# Patient Record
Sex: Female | Born: 1970 | Race: White | Hispanic: No | Marital: Married | Smoking: Former smoker
Health system: Southern US, Community
[De-identification: ages and names within clinical notes are randomized; demographics above are authoritative.]

## PROBLEM LIST (undated history)

## (undated) DIAGNOSIS — R1013 Epigastric pain: Secondary | ICD-10-CM

## (undated) HISTORY — PX: NO PAST SURGERIES: SHX2092

## (undated) HISTORY — DX: Epigastric pain: R10.13

---

## 1998-08-16 ENCOUNTER — Ambulatory Visit (HOSPITAL_COMMUNITY): Admission: RE | Admit: 1998-08-16 | Discharge: 1998-08-16 | Payer: Self-pay | Admitting: Obstetrics & Gynecology

## 1999-01-11 ENCOUNTER — Inpatient Hospital Stay (HOSPITAL_COMMUNITY): Admission: AD | Admit: 1999-01-11 | Discharge: 1999-01-13 | Payer: Self-pay | Admitting: Obstetrics and Gynecology

## 2000-12-25 ENCOUNTER — Encounter: Payer: Self-pay | Admitting: Obstetrics and Gynecology

## 2000-12-25 ENCOUNTER — Encounter: Admission: RE | Admit: 2000-12-25 | Discharge: 2000-12-25 | Payer: Self-pay | Admitting: Obstetrics and Gynecology

## 2002-01-11 ENCOUNTER — Other Ambulatory Visit: Admission: RE | Admit: 2002-01-11 | Discharge: 2002-01-11 | Payer: Self-pay | Admitting: Obstetrics and Gynecology

## 2003-02-06 ENCOUNTER — Other Ambulatory Visit: Admission: RE | Admit: 2003-02-06 | Discharge: 2003-02-06 | Payer: Self-pay | Admitting: Obstetrics and Gynecology

## 2005-10-20 ENCOUNTER — Other Ambulatory Visit: Admission: RE | Admit: 2005-10-20 | Discharge: 2005-10-20 | Payer: Self-pay | Admitting: Obstetrics and Gynecology

## 2005-11-05 ENCOUNTER — Encounter: Admission: RE | Admit: 2005-11-05 | Discharge: 2005-11-05 | Payer: Self-pay | Admitting: Obstetrics and Gynecology

## 2006-01-05 ENCOUNTER — Other Ambulatory Visit: Admission: RE | Admit: 2006-01-05 | Discharge: 2006-01-05 | Payer: Self-pay | Admitting: Obstetrics and Gynecology

## 2008-08-31 ENCOUNTER — Emergency Department (HOSPITAL_COMMUNITY): Admission: EM | Admit: 2008-08-31 | Discharge: 2008-08-31 | Payer: Self-pay | Admitting: Emergency Medicine

## 2010-03-05 ENCOUNTER — Ambulatory Visit (HOSPITAL_COMMUNITY): Admission: RE | Admit: 2010-03-05 | Discharge: 2010-03-05 | Payer: Self-pay | Admitting: Family Medicine

## 2011-04-14 ENCOUNTER — Other Ambulatory Visit (HOSPITAL_COMMUNITY): Payer: Self-pay | Admitting: Family Medicine

## 2011-04-14 DIAGNOSIS — Z1231 Encounter for screening mammogram for malignant neoplasm of breast: Secondary | ICD-10-CM

## 2011-04-17 ENCOUNTER — Ambulatory Visit (HOSPITAL_COMMUNITY)
Admission: RE | Admit: 2011-04-17 | Discharge: 2011-04-17 | Disposition: A | Discharge status: Home / Self Care | Payer: Medicaid Other | Source: Ambulatory Visit | Attending: Family Medicine | Admitting: Family Medicine

## 2011-04-17 DIAGNOSIS — Z1231 Encounter for screening mammogram for malignant neoplasm of breast: Secondary | ICD-10-CM

## 2011-05-13 NOTE — L&D Delivery Note (Signed)
Delivery Note At 11:38 AM a viable female was delivered via Vaginal, Spontaneous Delivery (Presentation: Left Occiput Anterior), nuchal cord x 3- unwound after birth.   Thick meconium- NICU called for birth, arrived right after, but infant had spontaneous respiration/cry. Cord clamped x 2 and cut by FOB, then to warmer for assessment by NICU. APGAR: 9, 9; weight: pending.  Infant back to mom, skin-to-skin, and attempting latch.  Placenta status: Intact, Spontaneous, battledore insertion.  Cord: 3 vessels with the following complications: None.    Anesthesia: Local  Episiotomy: None Lacerations: 2nd deg perineal Suture Repair: 3.0 monocryl Est. Blood Loss (mL):  Mom to postpartum.  Baby to nursery-stable.  Plans to breastfeed, desires Depo for contraception.  [name] 04/10/2012, 12:05 PM

## 2011-08-21 LAB — OB RESULTS CONSOLE GC/CHLAMYDIA: Chlamydia: NEGATIVE

## 2011-09-11 ENCOUNTER — Other Ambulatory Visit (HOSPITAL_COMMUNITY)
Admission: RE | Admit: 2011-09-11 | Discharge: 2011-09-11 | Disposition: A | Discharge status: Home / Self Care | Payer: Medicaid Other | Source: Ambulatory Visit | Attending: Obstetrics and Gynecology | Admitting: Obstetrics and Gynecology

## 2011-09-11 ENCOUNTER — Other Ambulatory Visit: Payer: Self-pay | Admitting: Adult Health

## 2011-09-11 DIAGNOSIS — Z01419 Encounter for gynecological examination (general) (routine) without abnormal findings: Secondary | ICD-10-CM | POA: Insufficient documentation

## 2011-09-11 DIAGNOSIS — Z113 Encounter for screening for infections with a predominantly sexual mode of transmission: Secondary | ICD-10-CM | POA: Insufficient documentation

## 2012-04-09 ENCOUNTER — Encounter (HOSPITAL_COMMUNITY): Payer: Self-pay

## 2012-04-09 ENCOUNTER — Inpatient Hospital Stay (HOSPITAL_COMMUNITY)
Admission: AD | Admit: 2012-04-09 | Discharge: 2012-04-09 | Disposition: A | Discharge status: Home / Self Care | Payer: Medicaid Other | Source: Ambulatory Visit | Attending: Family Medicine | Admitting: Family Medicine

## 2012-04-09 DIAGNOSIS — O479 False labor, unspecified: Secondary | ICD-10-CM | POA: Insufficient documentation

## 2012-04-09 NOTE — MAU Provider Note (Signed)
I examined pt and agree with documentation above and resident plan of care. MUHAMMAD,Joeann Steppe  

## 2012-04-09 NOTE — MAU Note (Signed)
Pt states u/c's q6-8 minutes apart. Hx rapid labor, was ? 1cm in office

## 2012-04-09 NOTE — MAU Note (Signed)
Dr. Sherron Flemings Paz notified amnisure negative, pt would like to walk around, orders to walk and Dr will recheck in one hour.

## 2012-04-09 NOTE — MAU Provider Note (Signed)
Chief Complaint:  Contractions  HPI: Brianna Walsh is a 41 y.o. [phone] at [redacted]w[redacted]d who presents to maternity admissions reporting contractions since 4:00 am. She has history of rapid deliveries in the past. Also she reports having vaginal discharge that wet her inner thighs. No gush of fluid per vagina or vaginal bleeding. Good fetal movement.   Pregnancy Course: f/u by [facility] Uncomplicated prenatal course.  Past Medical History: History reviewed. No pertinent past medical history.  Past obstetric history: OB History    Grav Para Term Preterm Abortions TAB SAB Ect Mult Living   4 3 3       3      Past Surgical History: Past Surgical History  Procedure Date  . No past surgeries     Family History: Family History  Problem Relation Age of Onset  . Other Neg Hx     Social History: History  Substance Use Topics  . Smoking status: Never Smoker   . Smokeless tobacco: Never Used  . Alcohol Use: No    Allergies:  Allergies  Allergen Reactions  . Penicillins Other (See Comments)    Childhood reaction.    Meds:  Prescriptions prior to admission  Medication Sig Dispense Refill  . OMEPRAZOLE PO Take 1 tablet by mouth daily as needed. For acid reflux.      . Prenatal Vit-Fe Fumarate-FA (PRENATAL MULTIVITAMIN) TABS Take 1 tablet by mouth daily.        ROS: Pertinent findings in history of present illness.  Physical Exam  There were no vitals taken for this visit. GENERAL: Well-developed, well-nourished female in no acute distress.  HEENT: normocephalic HEART: normal rate RESP: normal effort ABDOMEN: Soft, non-tender, gravid appropriate for gestational age EXTREMITIES: Nontender, no edema NEURO: alert and oriented SPECULUM EXAM: NEFG, white discharge, no blood, cervix clean. Dilation: 1 Effacement (%): 60 Cervical Position: Posterior Station: -1 Presentation: Vertex Exam by:: Dr. Sherron Flemings Paz  FHT:  Baseline 145 , moderate variability, accelerations present, no  decelerations Contractions: q 2-3 mins   Labs: Results for orders placed during the hospital encounter of 04/09/12 (from the past 24 hour(s))  AMNISURE RUPTURE OF MEMBRANE (ROM)     Status: Normal   Collection Time   04/09/12  9:49 AM      Component Value Range   Amnisure ROM NEGATIVE     MAU Course: Negative ferning and amnisure.  Will let pt off monitor and ambulate due to good contraction pattern. Will recheck in 2 h  Dilation: 1 Effacement (%): 50 Cervical Position: Posterior Station: -3 Presentation: Vertex Exam by:: Dr. Aviva Signs and Jerolyn Center, CNM FHT: category II. Contractions spaced to irregular pattern. Non painful.  Assessment: 1. False labor     Plan: Discharge home Labor precautions and fetal kick counts    Medication List     As of 04/09/2012 10:17 AM    ASK your doctor about these medications         OMEPRAZOLE PO   Take 1 tablet by mouth daily as needed. For acid reflux.      prenatal multivitamin Tabs   Take 1 tablet by mouth daily.        Brianna Jefcoat [place] Criselda Peaches, MD 04/09/2012 10:17 AM

## 2012-04-09 NOTE — MAU Note (Signed)
Dr. Rolene Arbour notified pt in MAU for labor eval, hx rapid labor, AMA, ctx's q2-4, RN to check cervix and call with results.

## 2012-04-10 ENCOUNTER — Inpatient Hospital Stay (HOSPITAL_COMMUNITY)
Admission: AD | Admit: 2012-04-10 | Discharge: 2012-04-11 | DRG: 775 | Disposition: A | Discharge status: Home / Self Care | Payer: Medicaid Other | Source: Ambulatory Visit | Attending: Obstetrics and Gynecology | Admitting: Obstetrics and Gynecology

## 2012-04-10 ENCOUNTER — Encounter (HOSPITAL_COMMUNITY): Payer: Self-pay | Admitting: *Deleted

## 2012-04-10 DIAGNOSIS — O09529 Supervision of elderly multigravida, unspecified trimester: Secondary | ICD-10-CM | POA: Diagnosis present

## 2012-04-10 LAB — CBC
HCT: 37.3 % (ref 36.0–46.0)
Hemoglobin: 12.3 g/dL (ref 12.0–15.0)
MCH: 27.5 pg (ref 26.0–34.0)
MCHC: 33 g/dL (ref 30.0–36.0)
MCV: 83.3 fL (ref 78.0–100.0)
RDW: 13.6 % (ref 11.5–15.5)

## 2012-04-10 LAB — OB RESULTS CONSOLE ANTIBODY SCREEN: Antibody Screen: NEGATIVE

## 2012-04-10 LAB — OB RESULTS CONSOLE ABO/RH: RH Type: POSITIVE

## 2012-04-10 LAB — OB RESULTS CONSOLE HEPATITIS B SURFACE ANTIGEN: Hepatitis B Surface Ag: NEGATIVE

## 2012-04-10 LAB — OB RESULTS CONSOLE RPR: RPR: NONREACTIVE

## 2012-04-10 MED ORDER — DIBUCAINE 1 % RE OINT: 1.0000 "application " | TOPICAL_OINTMENT | RECTAL | Status: DC | PRN

## 2012-04-10 MED ORDER — LIDOCAINE HCL (PF) 1 % IJ SOLN
30.0000 mL | INTRAMUSCULAR | Status: DC | PRN
  Administered 2012-04-10: 30 mL via SUBCUTANEOUS

## 2012-04-10 MED ORDER — DIPHENHYDRAMINE HCL 50 MG/ML IJ SOLN: 12.5000 mg | INTRAMUSCULAR | Status: DC | PRN

## 2012-04-10 MED ORDER — ONDANSETRON HCL 4 MG/2ML IJ SOLN: 4.0000 mg | Freq: Four times a day (QID) | INTRAMUSCULAR | Status: DC | PRN

## 2012-04-10 MED ORDER — EPHEDRINE 5 MG/ML INJ: 10.0000 mg | INTRAVENOUS | Status: DC | PRN

## 2012-04-10 MED ORDER — OXYTOCIN 40 UNITS IN LACTATED RINGERS INFUSION - SIMPLE MED: 62.5000 mL/h | INTRAVENOUS | Status: DC

## 2012-04-10 MED ORDER — OXYTOCIN 40 UNITS IN LACTATED RINGERS INFUSION - SIMPLE MED: 62.5000 mL/h | INTRAVENOUS | Status: DC | PRN

## 2012-04-10 MED ORDER — ONDANSETRON HCL 4 MG/2ML IJ SOLN: 4.0000 mg | INTRAMUSCULAR | Status: DC | PRN

## 2012-04-10 MED ORDER — OXYTOCIN BOLUS FROM INFUSION
500.0000 mL | INTRAVENOUS | Status: DC
  Administered 2012-04-10: 500 mL via INTRAVENOUS

## 2012-04-10 MED ORDER — LACTATED RINGERS IV SOLN: 500.0000 mL | INTRAVENOUS | Status: DC | PRN

## 2012-04-10 MED ORDER — LACTATED RINGERS IV SOLN
500.0000 mL | Freq: Once | INTRAVENOUS | Status: AC
  Administered 2012-04-10: 500 mL via INTRAVENOUS

## 2012-04-10 MED ORDER — MEASLES, MUMPS & RUBELLA VAC ~~LOC~~ INJ
0.5000 mL | INJECTION | Freq: Once | SUBCUTANEOUS | Status: DC
  Filled 2012-04-10: qty 0.5

## 2012-04-10 MED ORDER — FENTANYL 2.5 MCG/ML BUPIVACAINE 1/10 % EPIDURAL INFUSION (WH - ANES): 14.0000 mL/h | INTRAMUSCULAR | Status: DC

## 2012-04-10 MED ORDER — PHENYLEPHRINE 40 MCG/ML (10ML) SYRINGE FOR IV PUSH (FOR BLOOD PRESSURE SUPPORT): 80.0000 ug | PREFILLED_SYRINGE | INTRAVENOUS | Status: DC | PRN

## 2012-04-10 MED ORDER — LIDOCAINE HCL (PF) 1 % IJ SOLN
INTRAMUSCULAR | Status: AC
  Administered 2012-04-10: 30 mL via SUBCUTANEOUS
  Filled 2012-04-10: qty 30

## 2012-04-10 MED ORDER — ZOLPIDEM TARTRATE 5 MG PO TABS: 5.0000 mg | ORAL_TABLET | Freq: Every evening | ORAL | Status: DC | PRN

## 2012-04-10 MED ORDER — ONDANSETRON HCL 4 MG PO TABS: 4.0000 mg | ORAL_TABLET | ORAL | Status: DC | PRN

## 2012-04-10 MED ORDER — ACETAMINOPHEN 325 MG PO TABS: 650.0000 mg | ORAL_TABLET | ORAL | Status: DC | PRN

## 2012-04-10 MED ORDER — LANOLIN HYDROUS EX OINT: TOPICAL_OINTMENT | CUTANEOUS | Status: DC | PRN

## 2012-04-10 MED ORDER — OXYTOCIN 40 UNITS IN LACTATED RINGERS INFUSION - SIMPLE MED
INTRAVENOUS | Status: AC
  Filled 2012-04-10: qty 1000

## 2012-04-10 MED ORDER — IBUPROFEN 600 MG PO TABS
600.0000 mg | ORAL_TABLET | Freq: Four times a day (QID) | ORAL | Status: DC | PRN
  Administered 2012-04-10: 600 mg via ORAL
  Filled 2012-04-10: qty 1

## 2012-04-10 MED ORDER — IBUPROFEN 600 MG PO TABS
600.0000 mg | ORAL_TABLET | Freq: Four times a day (QID) | ORAL | Status: DC
  Administered 2012-04-10 – 2012-04-11 (×4): 600 mg via ORAL
  Filled 2012-04-10 (×4): qty 1

## 2012-04-10 MED ORDER — PANTOPRAZOLE SODIUM 40 MG PO TBEC
40.0000 mg | DELAYED_RELEASE_TABLET | Freq: Every day | ORAL | Status: DC
  Filled 2012-04-10 (×3): qty 1

## 2012-04-10 MED ORDER — SODIUM CHLORIDE 0.9 % IJ SOLN: 3.0000 mL | INTRAMUSCULAR | Status: DC | PRN

## 2012-04-10 MED ORDER — FENTANYL CITRATE 0.05 MG/ML IJ SOLN
INTRAMUSCULAR | Status: AC
  Filled 2012-04-10: qty 2

## 2012-04-10 MED ORDER — BENZOCAINE-MENTHOL 20-0.5 % EX AERO: 1.0000 "application " | INHALATION_SPRAY | CUTANEOUS | Status: DC | PRN

## 2012-04-10 MED ORDER — BISACODYL 10 MG RE SUPP: 10.0000 mg | Freq: Every day | RECTAL | Status: DC | PRN

## 2012-04-10 MED ORDER — OXYCODONE-ACETAMINOPHEN 5-325 MG PO TABS: 1.0000 | ORAL_TABLET | ORAL | Status: DC | PRN

## 2012-04-10 MED ORDER — FLEET ENEMA 7-19 GM/118ML RE ENEM: 1.0000 | ENEMA | Freq: Every day | RECTAL | Status: DC | PRN

## 2012-04-10 MED ORDER — PRENATAL MULTIVITAMIN CH
1.0000 | ORAL_TABLET | Freq: Every day | ORAL | Status: DC
  Administered 2012-04-11: 1 via ORAL
  Filled 2012-04-10: qty 1

## 2012-04-10 MED ORDER — TETANUS-DIPHTH-ACELL PERTUSSIS 5-2.5-18.5 LF-MCG/0.5 IM SUSP: 0.5000 mL | Freq: Once | INTRAMUSCULAR | Status: DC

## 2012-04-10 MED ORDER — SENNOSIDES-DOCUSATE SODIUM 8.6-50 MG PO TABS
2.0000 | ORAL_TABLET | Freq: Every day | ORAL | Status: DC
  Administered 2012-04-10: 2 via ORAL

## 2012-04-10 MED ORDER — WITCH HAZEL-GLYCERIN EX PADS: 1.0000 "application " | MEDICATED_PAD | CUTANEOUS | Status: DC | PRN

## 2012-04-10 MED ORDER — FENTANYL CITRATE 0.05 MG/ML IJ SOLN: 100.0000 ug | Freq: Once | INTRAMUSCULAR | Status: DC

## 2012-04-10 MED ORDER — SODIUM CHLORIDE 0.9 % IJ SOLN
3.0000 mL | Freq: Two times a day (BID) | INTRAMUSCULAR | Status: DC
  Administered 2012-04-10: 3 mL via INTRAVENOUS

## 2012-04-10 MED ORDER — CITRIC ACID-SODIUM CITRATE 334-500 MG/5ML PO SOLN: 30.0000 mL | ORAL | Status: DC | PRN

## 2012-04-10 MED ORDER — LACTATED RINGERS IV SOLN
INTRAVENOUS | Status: DC
  Administered 2012-04-10: 11:00:00 via INTRAVENOUS

## 2012-04-10 MED ORDER — SIMETHICONE 80 MG PO CHEW: 80.0000 mg | CHEWABLE_TABLET | ORAL | Status: DC | PRN

## 2012-04-10 MED ORDER — SODIUM CHLORIDE 0.9 % IV SOLN: 250.0000 mL | INTRAVENOUS | Status: DC | PRN

## 2012-04-10 MED ORDER — DIPHENHYDRAMINE HCL 25 MG PO CAPS: 25.0000 mg | ORAL_CAPSULE | Freq: Four times a day (QID) | ORAL | Status: DC | PRN

## 2012-04-10 NOTE — MAU Note (Signed)
Pt reports having ctx feel lots of pressure no leaking reported

## 2012-04-10 NOTE — Progress Notes (Signed)
Brianna Walsh is a 41 y.o. [phone] at [redacted]w[redacted]d admitted for active labor  Subjective: Very uncomfortable w/ uc's, 'just wants water broken so i can have a baby'.  Husband supportive at bs.   Objective: Ht 5\' 6"  (1.676 m)  Wt 73.483 kg (162 lb)  BMI 26.15 kg/m2      FHT:  FHR: 155 bpm, variability: moderate,  accelerations:  Present,  decelerations:  Present variables, prolonged UC:   regular, every 2-4 minutes SVE:   Dilation: 4.5 Effacement (%): 90 Station: 0 Exam by:: Genella Rife RN AROM small amount moderate meconium  Labs: Lab Results  Component Value Date   WBC 16.0* 04/10/2012   HGB 12.3 04/10/2012   HCT 37.3 04/10/2012   MCV 83.3 04/10/2012   PLT 228 04/10/2012    Assessment / Plan: SOL, actually 4.5cm, now arom'd  Labor: Progressing normally Preeclampsia:  n/a Fetal Wellbeing:  Category II Pain Control:  desires epidural I/D:  n/a Anticipated MOD:  NSVD  [name] 04/10/2012, 11:21 AM

## 2012-04-10 NOTE — H&P (Signed)
Brianna Walsh is a 60 [id number]  female at [redacted]w[redacted]d presenting in active labor, membranes intact.  Regular care @ [facility] beginning at 6wks.  Has been followed w/ twice weekly nst's since 32wks d/t AMA-all reactive.  U/S @ 36wks, bpp 8/8, afi wnl, efw 2736gm/38.8%. Genetic screening, anatomy scan , and 2hr glucola normal, gbs neg. H/O rapid labors.   Maternal Medical History:  Reason for admission: Reason for admission: contractions.  Fetal activity: Perceived fetal activity is normal.   Last perceived fetal movement was within the past hour.    Prenatal complications: no prenatal complications Prenatal Complications - Diabetes: none.    OB History    Grav Para Term Preterm Abortions TAB SAB Ect Mult Living   4 3 3       3      History reviewed. No pertinent past medical history. Past Surgical History  Procedure Date  . No past surgeries    Family History: family history is negative for Other. Social History:  reports that she has never smoked. She has never used smokeless tobacco. She reports that she does not drink alcohol or use illicit drugs.   Prenatal Transfer Tool  Maternal Diabetes: No Genetic Screening: Normal Maternal Ultrasounds/Referrals: Normal Fetal Ultrasounds or other Referrals:  None Maternal Substance Abuse:  No Significant Maternal Medications:  None Significant Maternal Lab Results:  Lab values include: Group B Strep negative Other Comments:  None  Review of Systems  Constitutional: Negative.   HENT: Negative.   Eyes: Negative.   Respiratory: Negative.   Cardiovascular: Negative.   Gastrointestinal: Positive for abdominal pain (r/t uc's).  Genitourinary: Negative.   Musculoskeletal: Negative.   Skin: Negative.   Neurological: Negative.   Endo/Heme/Allergies: Negative.   Psychiatric/Behavioral: Negative.     Dilation: 9 Effacement (%): 100 Station: 0 Exam by:: K.Wilson,RN There were no vitals taken for this visit. Maternal Exam:  Uterine  Assessment: Contraction strength is firm.  Contraction frequency is regular.   Abdomen: Fetal presentation: vertex     Physical Exam  Constitutional: She is oriented to person, place, and time. She appears well-developed and well-nourished.  HENT:  Head: Normocephalic.  Neck: Normal range of motion.  Cardiovascular: Normal rate and regular rhythm.   Respiratory: Effort normal and breath sounds normal.  GI: Soft.       gravid  Genitourinary: Vagina normal and uterus normal.  Musculoskeletal: Normal range of motion.  Neurological: She is alert and oriented to person, place, and time. She has normal reflexes.  Skin: Skin is warm and dry.  Psychiatric: She has a normal mood and affect. Her behavior is normal. Judgment and thought content normal.    FHT: 145, mod variability, 10x10accels, mild variable decel= Cat 2 UCs: q 2-4, strong Prenatal labs: ABO, Rh:  O+ Antibody:  neg Rubella:  immune RPR:   neg HBsAg:   neg HIV:   neg GBS:   neg  Assessment/Plan: A:  [redacted]w[redacted]d SIUP  AMA  Active labor- advanced dilation  GBS neg  Cat II FHR  P:  Admit to BS  May have IV pain meds/epidural at maternal request  Expectant management  Anticipate NSVD  [name] 04/10/2012, 10:59 AM

## 2012-04-11 MED ORDER — IBUPROFEN 600 MG PO TABS: 600.0000 mg | ORAL_TABLET | Freq: Four times a day (QID) | ORAL | Status: DC | PRN

## 2012-04-11 NOTE — Discharge Summary (Signed)
Obstetric Discharge Summary Reason for Admission: onset of labor Prenatal Procedures: ultrasound Intrapartum Procedures: spontaneous vaginal delivery Postpartum Procedures: none Complications-Operative and Postpartum: 2nd degree perineal laceration  Eating, drinking, voiding well.  + flatus. Lochia wnl.  Pain well controlled w/ meds. No complaints. Desires early d/c   Hemoglobin  Date Value Range Status  04/10/2012 12.3  12.0 - 15.0 g/dL Final     HCT  Date Value Range Status  04/10/2012 37.3  36.0 - 46.0 % Final    Physical Exam:  General: alert, cooperative and no distress Lochia: appropriate Uterine Fundus: firm Incision: n/a DVT Evaluation: No evidence of DVT seen on physical exam. Negative Homan's sign. No cords or calf tenderness. No significant calf/ankle edema.  Discharge Diagnoses: Term Pregnancy-delivered  Discharge Information: Date: 04/11/2012 Activity: pelvic rest Diet: routine Medications: PNV and Ibuprofen Condition: stable Instructions: refer to practice specific booklet Discharge to: home Follow-up Information    Follow up with [facility]. Schedule an appointment as soon as possible for a visit in 6 weeks.   Contact information:   [address] [place] [address] [phone]         Newborn Data: Live born female  Birth Weight: 6 lb 5.9 oz (2890 g) APGAR: 9, 9  Home with mother. Breastfeeding, thinking about depo for contraception.   [name] 04/11/2012, 7:24 AM

## 2012-04-12 NOTE — Discharge Summary (Signed)
Attestation of Attending Supervision of Advanced Practitioner (CNM/NP): Evaluation and management procedures were performed by the Advanced Practitioner under my supervision and collaboration.  I have reviewed the Advanced Practitioner's note and chart, and I agree with the management and plan.  Kaytelynn Scripter 04/12/2012 12:41 PM   

## 2012-04-12 NOTE — Progress Notes (Signed)
Post discharge chart review completed.  

## 2012-04-12 NOTE — H&P (Signed)
Attestation of Attending Supervision of Advanced Practitioner (CNM/NP): Evaluation and management procedures were performed by the Advanced Practitioner under my supervision and collaboration.  I have reviewed the Advanced Practitioner's note and chart, and I agree with the management and plan.  Brianna Walsh 04/12/2012 12:41 PM

## 2012-06-09 ENCOUNTER — Ambulatory Visit: Payer: Medicaid Other | Admitting: Orthopedic Surgery

## 2012-06-22 ENCOUNTER — Ambulatory Visit (INDEPENDENT_AMBULATORY_CARE_PROVIDER_SITE_OTHER): Payer: Medicaid Other

## 2012-06-22 ENCOUNTER — Encounter: Payer: Self-pay | Admitting: Orthopedic Surgery

## 2012-06-22 ENCOUNTER — Ambulatory Visit (INDEPENDENT_AMBULATORY_CARE_PROVIDER_SITE_OTHER): Payer: Medicaid Other | Admitting: Orthopedic Surgery

## 2012-06-22 ENCOUNTER — Other Ambulatory Visit: Payer: Self-pay | Admitting: Orthopedic Surgery

## 2012-06-22 VITALS — BP 104/60 | Ht 65.0 in | Wt 145.0 lb

## 2012-06-22 DIAGNOSIS — M25539 Pain in unspecified wrist: Secondary | ICD-10-CM

## 2012-06-22 DIAGNOSIS — M654 Radial styloid tenosynovitis [de Quervain]: Secondary | ICD-10-CM

## 2012-06-22 NOTE — Patient Instructions (Addendum)
De Quervain's Tenosynovitis De Quervain's tenosynovitis involves inflammation of one or two tendon linings (sheaths) or strain of one or two tendons to the thumb: extensor pollicis brevis (EPB), or abductor pollicis longus (APL). This causes pain on the side of the wrist and base of the thumb. Tendon sheaths secrete a fluid that lubricates the tendon, allowing the tendon to move smoothly. When the sheath becomes inflamed, the tendon cannot move freely in the sheath. Both the EPB and APL tendons are important for proper use of the hand. The EPB tendon is important for straightening the thumb. The APL tendon is important for moving the thumb away from the index finger (abducting). The two tendons pass through a small tube (canal) in the wrist, near the base of the thumb. When the tendons become inflamed, pain is usually felt in this area. SYMPTOMS   Pain, tenderness, swelling, warmth, or redness over the base of the thumb and thumb side of the wrist.  Pain that gets worse when straightening the thumb.  Pain that gets worse when moving the thumb away from the index finger, against resistance.  Pain with pinching or gripping.  Locking or catching of the thumb.  Limited motion of the thumb.  Crackling sound (crepitation) when the tendon or thumb is moved or touched.  Fluid-filled cyst in the area of the base of the thumb. CAUSES   Tenosynovitis is often linked with overuse of the wrist.  Tenosynovitis may be caused by repeated injury to the thumb muscle and tendon units, and with repeated motions of the hand and wrist, due to friction of the tendon within the lining (sheath).  Tenosynovitis may also be due to a sudden increase in activity or change in activity. RISK INCREASES WITH:  Sports that involve repeated hand and wrist motions (golf, bowling, tennis, squash, racquetball).  Heavy labor.  Poor physical wrist strength and flexibility.  Failure to warm up properly before practice or  play.  Female gender.  New mothers who hold their baby's head for long periods or lift infants with thumbs in the infant's armpit (axilla). PREVENTION  Warm up and stretch properly before practice or competition.  Allow enough time for rest and recovery between practices and competition.  Maintain appropriate conditioning:  Cardiovascular fitness.  Forearm, wrist, and hand flexibility.  Muscle strength and endurance.  Use proper exercise technique. PROGNOSIS  This condition is usually curable within 6 weeks, if treated properly with non-surgical treatment and resting of the affected area.  RELATED COMPLICATIONS   Longer healing time if not properly treated or if not given enough time to heal.  Chronic inflammation, causing recurring symptoms of tenosynovitis. Permanent pain or restriction of movement.  Risks of surgery: infection, bleeding, injury to nerves (numbness of the thumb), continued pain, incomplete release of the tendon sheath, recurring symptoms, cutting of the tendons, tendons sliding out of position, weakness of the thumb, thumb stiffness. TREATMENT  First, treatment involves the use of medicine and ice, to reduce pain and inflammation. Patients are encouraged to stop or modify activities that aggravate the injury. Stretching and strengthening exercises may be advised. Exercises may be completed at home or with a therapist. You may be fitted with a brace or splint, to limit motion and allow the injury to heal. Your caregiver may also choose to give you a corticosteroid injection, to reduce the pain and inflammation. If non-surgical treatment is not successful, surgery may be needed. Most tenosynovitis surgeries are done as outpatient procedures (you go home the   same day). Surgery may involve local, regional (whole arm), or general anesthesia.  MEDICATION   If pain medicine is needed, nonsteroidal anti-inflammatory medicines (aspirin and ibuprofen), or other minor pain  relievers (acetaminophen), are often advised.  Do not take pain medicine for 7 days before surgery.  Prescription pain relievers are often prescribed only after surgery. Use only as directed and only as much as you need.  Corticosteroid injections may be given if your caregiver thinks they are needed. There is a limited number of times these injections may be given. COLD THERAPY   Cold treatment (icing) should be applied for 10 to 15 minutes every 2 to 3 hours for inflammation and pain, and immediately after activity that aggravates your symptoms. Use ice packs or an ice massage. SEEK MEDICAL CARE IF:   Symptoms get worse or do not improve in 2 to 4 weeks, despite treatment.  You experience pain, numbness, or coldness in the hand.  Blue, gray, or dark color appears in the fingernails.  Any of the following occur after surgery: increased pain, swelling, redness, drainage of fluids, bleeding in the affected area, or signs of infection.  New, unexplained symptoms develop. (Drugs used in treatment may produce side effects.) Document Released: 04/28/2005 Document Revised: 07/21/2011 Document Reviewed: 08/10/2008 [facility] Patient Information 2013 [place], [place].  Treatment wrist splinting x6 weeks and oral anti-inflammatory of choice x6 weeks, you should continue to ice applied for 20 minutes 3 times a day

## 2012-06-22 NOTE — Progress Notes (Signed)
Patient ID: Brianna Walsh, female   DOB: [birth year], 42 y.o.   MRN: [id number] Chief Complaint  Patient presents with  . Wrist Pain    Left wrist pain, no injury pain started 12/2011    Patient is recently postpartum she had a healthy girl she started having pain in her left and right wrist prior to delivery in August of 2013 she was diagnosed with carpal tunnel syndrome but she was sure that it was not she was placed in a wrist splint she is to ice but no NSAIDs secondary to the pregnancy she now complains of 4/10 sharp left wrist pain over the first extensor compartment which is constant it seems to be worse at night, no relief at this point worse with sleeping and she has a swollen nodule in the area of the first extensor compartment  Review of systems she denied weight loss she denied weight gain blurred vision skin changes numbness or tingling    History reviewed. No pertinent past medical history.  BP 104/60  Ht 5\' 5"  (1.651 m)  Wt 145 lb (65.772 kg)  BMI 24.13 kg/m2  General appearance is normal Orientation x3 normal Mood and affect normal Ambulation noncontributory but normal  Right wrist has a similar area of swelling but minimal tenderness normal range of motion normal stability normal strength normal skin really a negative Finkelstein's maneuver  Left wrist hand shows a nodule over the first extensor compartment near the radial styloid with tenderness painful extension of the thumb painful Finkelstein over normal range of motion normal stability normal strength normal skin normal pulse normal perfusion normal sensation  Medical decision making order x-ray  Interpret imaging see report x-ray normal  Diagnosis new problem no further workup planned other than the x-ray that we've taken  Overall risk recommend prescription medication bracing  Followup in 6 weeks.

## 2012-08-03 ENCOUNTER — Ambulatory Visit: Payer: Medicaid Other | Admitting: Orthopedic Surgery

## 2012-12-29 ENCOUNTER — Ambulatory Visit: Payer: 59

## 2012-12-29 ENCOUNTER — Ambulatory Visit (INDEPENDENT_AMBULATORY_CARE_PROVIDER_SITE_OTHER): Payer: Medicaid Other | Admitting: Orthopedic Surgery

## 2012-12-29 ENCOUNTER — Ambulatory Visit (INDEPENDENT_AMBULATORY_CARE_PROVIDER_SITE_OTHER): Payer: Medicaid Other

## 2012-12-29 VITALS — BP 118/84 | Ht 65.0 in | Wt 149.0 lb

## 2012-12-29 DIAGNOSIS — M25552 Pain in left hip: Secondary | ICD-10-CM

## 2012-12-29 DIAGNOSIS — M25559 Pain in unspecified hip: Secondary | ICD-10-CM

## 2012-12-29 DIAGNOSIS — M25551 Pain in right hip: Secondary | ICD-10-CM

## 2012-12-29 NOTE — Patient Instructions (Addendum)
Ibuprofen as needed.

## 2012-12-29 NOTE — Progress Notes (Signed)
  Subjective:    Patient ID: Brianna Walsh, female    DOB: April 15, 1971, 42 y.o.   MRN: [id number]  HPI Comments: 65 or female previously seen by chiropractor for back pain which started in 2000 and presents now with sudden onset of pain which started in December she describes sharp back pain dull hip pain over the "bone" of her pelvis her pain is better with ibuprofen is worse when she's sleeping    Hip Pain  There was no injury mechanism. The pain is present in the right hip and left hip. The pain is at a severity of 8/10. The pain has been intermittent since onset. Associated symptoms include numbness and tingling. Pertinent negatives include no inability to bear weight, loss of motion, loss of sensation or muscle weakness.      Review of Systems  Musculoskeletal:       Stiffness  Neurological: Positive for tingling and numbness.  All other systems reviewed and are negative.       Objective:   Physical Exam   Vital signs are stable as recorded  General appearance is normal, body habitus normal  The patient is alert and oriented x 3  The patient's mood and affect are normal  Gait assessment: Normal  The cardiovascular exam reveals normal pulses and temperature without edema or  swelling.  The lymphatic system is negative for palpable lymph nodes  The sensory exam is normal.  There are no pathologic reflexes.  Balance is normal.   Exam of the back and lower extremities  Inspection she is tender in her lower back today but the SI joints are nontender she has a negative Faber test bilaterally range of motion in the hips is normal including the slammed on test for acetabular April impingement both hips are stable Both lower extremities exhibit Strength grade 5 motor strength  Skin normal, no rash, or laceration. Provocative tests negative  Bilateral x-rays normal both hips    Assessment & Plan:   Unexplained hip pain probably coming from the lower back relief  with ibuprofen indicates OK to take ibuprofen while breast-feeding according to her doctor's

## 2013-01-06 ENCOUNTER — Ambulatory Visit: Payer: Medicaid Other | Admitting: Adult Health

## 2013-04-25 ENCOUNTER — Telehealth: Payer: Self-pay | Admitting: *Deleted

## 2013-04-26 NOTE — Telephone Encounter (Signed)
Pt stated that she wants to get back on the Nuvaring. Pt advised to make an appointment with a provider to discuss [facility].

## 2013-05-10 ENCOUNTER — Encounter: Payer: Self-pay | Admitting: Advanced Practice Midwife

## 2013-05-10 ENCOUNTER — Ambulatory Visit (INDEPENDENT_AMBULATORY_CARE_PROVIDER_SITE_OTHER): Payer: Medicaid Other | Admitting: Advanced Practice Midwife

## 2013-05-10 VITALS — BP 110/78 | Ht 66.0 in | Wt 152.0 lb

## 2013-05-10 DIAGNOSIS — Z3202 Encounter for pregnancy test, result negative: Secondary | ICD-10-CM

## 2013-05-10 DIAGNOSIS — Z0000 Encounter for general adult medical examination without abnormal findings: Secondary | ICD-10-CM

## 2013-05-10 DIAGNOSIS — Z3049 Encounter for surveillance of other contraceptives: Secondary | ICD-10-CM

## 2013-05-10 MED ORDER — ETONOGESTREL-ETHINYL ESTRADIOL 0.12-0.015 MG/24HR VA RING: VAGINAL_RING | VAGINAL | Status: DC

## 2013-05-10 NOTE — Progress Notes (Signed)
KIMANH TEMPLEMAN 42 y.o.   Here for [facility] physical/ wants Nuva Ring. Quit breastfeeding 3 weeks ago.  Nonsmoker,  Has used NR in past.  Filed Vitals:   05/10/13 0840  BP: 110/78   History reviewed. No pertinent past medical history. Past Surgical History  Procedure Laterality Date  . No past surgeries     Family History  Problem Relation Age of Onset  . Other Neg Hx   . Cancer    . Cancer Mother     breast  . Cancer Father     lung    KEYRI SALBERG 23 y.o.  Filed Vitals:   05/10/13 0840  BP: 110/78     Past Medical History: History reviewed. No pertinent past medical history.  Past Surgical History: Past Surgical History  Procedure Laterality Date  . No past surgeries      Family History: Family History  Problem Relation Age of Onset  . Other Neg Hx   . Cancer    . Cancer Mother     breast  . Cancer Father     lung    Social History: History  Substance Use Topics  . Smoking status: Former Smoker -- 10 years    Types: Cigarettes    Quit date: 05/10/1997  . Smokeless tobacco: Never Used  . Alcohol Use: No    Allergies:  Allergies  Allergen Reactions  . Penicillins Other (See Comments)    Childhood reaction.     History of Present Illness:  Current outpatient prescriptions:Pseudoeph-CPM-DM-APAP (COLD CAPLETS PO), Take by mouth., Disp: , Rfl: ;  etonogestrel-ethinyl estradiol (NUVARING) 0.12-0.015 MG/24HR vaginal ring, Insert vaginally and leave in place for 3 consecutive weeks, then remove for 1 week., Disp: 1 each, Rfl: 12;  ibuprofen (ADVIL,MOTRIN) 600 MG tablet, Take 1 tablet (600 mg total) by mouth every 6 (six) hours as needed for pain., Disp: 30 tablet, Rfl: 0 norethindrone (MICRONOR,CAMILA,ERRIN) 0.35 MG tablet, Take 1 tablet by mouth daily., Disp: , Rfl: ;  OMEPRAZOLE PO, Take 1 tablet by mouth daily as needed. For acid reflux., Disp: , Rfl: ;  Prenatal Vit-Fe Fumarate-FA (PRENATAL MULTIVITAMIN) TABS, Take 1 tablet by mouth daily., Disp: ,  Rfl:     Review of Systems   Patient denies any headaches, blurred vision, shortness of breath, chest pain, abdominal pain, problems with bowel movements, urination, or intercourse.   Physical Exam: General:  Well developed, well nourished, no acute distress Skin:  Warm and dry Neck:  Midline trachea, normal thyroid Lungs; Clear to auscultation bilaterally Breast:  Deferred d/t recent breastfeeding.  Mammogram encouraged after the first of the year Cardiovascular: Regular rate and rhythm Abdomen:  Soft, non tender, no hepatosplenomegaly Pelvic:  Deferred.  Not pap smear candidate, had GYN exam < 1 year ago   Extremities:  No swelling or varicosities noted Psych:  No mood changes   Impression: Normal well woman exam Start NR now:  Use b/u for 3 weeks Screening labs 3 months (d/t breastfeeding)     Plan:

## 2013-08-03 ENCOUNTER — Ambulatory Visit: Payer: Medicaid Other | Admitting: Orthopedic Surgery

## 2013-10-05 ENCOUNTER — Telehealth: Payer: Self-pay | Admitting: Adult Health

## 2013-10-05 ENCOUNTER — Encounter: Payer: Self-pay | Admitting: Adult Health

## 2013-10-05 ENCOUNTER — Ambulatory Visit (INDEPENDENT_AMBULATORY_CARE_PROVIDER_SITE_OTHER): Payer: 59 | Admitting: Adult Health

## 2013-10-05 ENCOUNTER — Ambulatory Visit (HOSPITAL_COMMUNITY)
Admission: RE | Admit: 2013-10-05 | Discharge: 2013-10-05 | Disposition: A | Discharge status: Home / Self Care | Payer: 59 | Source: Ambulatory Visit | Attending: Adult Health | Admitting: Adult Health

## 2013-10-05 ENCOUNTER — Other Ambulatory Visit: Payer: Self-pay | Admitting: Adult Health

## 2013-10-05 ENCOUNTER — Telehealth: Payer: Self-pay | Admitting: Obstetrics and Gynecology

## 2013-10-05 VITALS — BP 128/92 | Ht 65.0 in | Wt 148.0 lb

## 2013-10-05 DIAGNOSIS — R1013 Epigastric pain: Secondary | ICD-10-CM

## 2013-10-05 DIAGNOSIS — K769 Liver disease, unspecified: Secondary | ICD-10-CM | POA: Insufficient documentation

## 2013-10-05 HISTORY — DX: Epigastric pain: R10.13

## 2013-10-05 LAB — COMPREHENSIVE METABOLIC PANEL
ALBUMIN: 4.1 g/dL (ref 3.5–5.2)
ALT: 10 U/L (ref 0–35)
AST: 13 U/L (ref 0–37)
Alkaline Phosphatase: 53 U/L (ref 39–117)
BUN: 7 mg/dL (ref 6–23)
CO2: 26 meq/L (ref 19–32)
CREATININE: 0.75 mg/dL (ref 0.50–1.10)
Calcium: 9 mg/dL (ref 8.4–10.5)
Chloride: 105 mEq/L (ref 96–112)
Glucose, Bld: 81 mg/dL (ref 70–99)
POTASSIUM: 4 meq/L (ref 3.5–5.3)
Sodium: 137 mEq/L (ref 135–145)
Total Bilirubin: 0.6 mg/dL (ref 0.2–1.2)
Total Protein: 7 g/dL (ref 6.0–8.3)

## 2013-10-05 LAB — LIPASE: Lipase: <10 U/L (ref 0–75)

## 2013-10-05 LAB — AMYLASE: AMYLASE: 41 U/L (ref 0–105)

## 2013-10-05 MED ORDER — PROMETHAZINE HCL 25 MG PO TABS: 25.0000 mg | ORAL_TABLET | Freq: Four times a day (QID) | ORAL | Status: DC | PRN

## 2013-10-05 NOTE — Progress Notes (Signed)
Subjective:     Patient ID: AMARIONNA ARCA, female   DOB: [birth year], 43 y.o.   MRN: [id number]  HPI Jalaine is a 43 year old white female in complaining of worsening pain in epigastric area,feels deep, no vomiting or nausea, but has bloating.No fever, but can't eat a lot.Has no relief with peeing or BM.Ate last night and had tea about 10 am today.  Review of Systems See HPI Reviewed past medical,surgical, social and family history. Reviewed medications and allergies.     Objective:   Physical Exam BP 128/92  Ht _0  (1.651 m)  Wt 148 lb (67.132 kg)  BMI 24.63 kg/m2  LMP 05/08/2015UPT negative, urine dipstick negative, has good bowel sounds in all 4 quadrants, has tenderness epigastric area and near navel.On pelvic exam external genitalia normal, vagina pink with good rugae, cervix smooth and bulbous, with Negative  CMT, uterus NSSC no masses or tenderness noted in adnexa.    Assessment:    Epigastric pain    Plan:     Get abdominal US now at [facility]  Check CBC,CMP ESR and lipase and amylase Will talk after [place]

## 2013-10-05 NOTE — Patient Instructions (Signed)
Go get [place] We will talk

## 2013-10-05 NOTE — Telephone Encounter (Signed)
Pt aware of [place] will rx phenergan and will talk in am after labs back ,will refer to surgeon

## 2013-10-05 NOTE — Telephone Encounter (Signed)
Spoke with pt. Pt is having abd pain. Has noticed it for 2 days, but pain is getting worse. Call transferred to front desk for appt. Pt to be seen today at 1:15pm. JSY

## 2013-10-06 ENCOUNTER — Telehealth: Payer: Self-pay | Admitting: Adult Health

## 2013-10-06 LAB — CBC
HEMATOCRIT: 40.5 % (ref 36.0–46.0)
Hemoglobin: 13.7 g/dL (ref 12.0–15.0)
MCH: 27.8 pg (ref 26.0–34.0)
MCHC: 33.8 g/dL (ref 30.0–36.0)
MCV: 82.2 fL (ref 78.0–100.0)
Platelets: 290 10*3/uL (ref 150–400)
RBC: 4.93 MIL/uL (ref 3.87–5.11)
RDW: 13.5 % (ref 11.5–15.5)
WBC: 8.6 10*3/uL (ref 4.0–10.5)

## 2013-10-06 LAB — SEDIMENTATION RATE: Sed Rate: 14 mm/hr (ref 0–22)

## 2013-10-06 NOTE — Telephone Encounter (Signed)
Pt aware has appt 6/2 with Dr Arnoldo Morale and she feels better

## 2013-10-06 NOTE — Telephone Encounter (Signed)
Left message to call me.

## 2014-02-08 ENCOUNTER — Other Ambulatory Visit: Payer: Self-pay | Admitting: Advanced Practice Midwife

## 2014-02-08 DIAGNOSIS — Z1231 Encounter for screening mammogram for malignant neoplasm of breast: Secondary | ICD-10-CM

## 2014-02-09 ENCOUNTER — Ambulatory Visit (HOSPITAL_COMMUNITY)
Admission: RE | Admit: 2014-02-09 | Discharge: 2014-02-09 | Disposition: A | Discharge status: Home / Self Care | Payer: 59 | Source: Ambulatory Visit | Attending: Advanced Practice Midwife | Admitting: Advanced Practice Midwife

## 2014-02-09 DIAGNOSIS — Z1231 Encounter for screening mammogram for malignant neoplasm of breast: Secondary | ICD-10-CM | POA: Insufficient documentation

## 2014-02-10 ENCOUNTER — Other Ambulatory Visit: Payer: Self-pay | Admitting: Advanced Practice Midwife

## 2014-02-10 DIAGNOSIS — R928 Other abnormal and inconclusive findings on diagnostic imaging of breast: Secondary | ICD-10-CM

## 2014-02-14 ENCOUNTER — Ambulatory Visit (HOSPITAL_COMMUNITY)
Admission: RE | Admit: 2014-02-14 | Discharge: 2014-02-14 | Disposition: A | Discharge status: Home / Self Care | Payer: 59 | Source: Ambulatory Visit | Attending: Advanced Practice Midwife | Admitting: Advanced Practice Midwife

## 2014-02-14 ENCOUNTER — Other Ambulatory Visit: Payer: Self-pay | Admitting: Advanced Practice Midwife

## 2014-02-14 DIAGNOSIS — R928 Other abnormal and inconclusive findings on diagnostic imaging of breast: Secondary | ICD-10-CM | POA: Diagnosis present

## 2014-02-14 DIAGNOSIS — R921 Mammographic calcification found on diagnostic imaging of breast: Secondary | ICD-10-CM

## 2014-02-15 ENCOUNTER — Encounter: Payer: Self-pay | Admitting: Advanced Practice Midwife

## 2014-02-15 ENCOUNTER — Ambulatory Visit (INDEPENDENT_AMBULATORY_CARE_PROVIDER_SITE_OTHER): Payer: 59 | Admitting: Advanced Practice Midwife

## 2014-02-15 ENCOUNTER — Other Ambulatory Visit (HOSPITAL_COMMUNITY)
Admission: RE | Admit: 2014-02-15 | Discharge: 2014-02-15 | Disposition: A | Discharge status: Home / Self Care | Payer: 59 | Source: Ambulatory Visit | Attending: Advanced Practice Midwife | Admitting: Advanced Practice Midwife

## 2014-02-15 VITALS — BP 110/80 | Ht 65.2 in | Wt 153.0 lb

## 2014-02-15 DIAGNOSIS — Z01419 Encounter for gynecological examination (general) (routine) without abnormal findings: Secondary | ICD-10-CM | POA: Insufficient documentation

## 2014-02-15 DIAGNOSIS — Z1151 Encounter for screening for human papillomavirus (HPV): Secondary | ICD-10-CM | POA: Insufficient documentation

## 2014-02-15 DIAGNOSIS — D1803 Hemangioma of intra-abdominal structures: Secondary | ICD-10-CM

## 2014-02-15 DIAGNOSIS — N63 Unspecified lump in unspecified breast: Secondary | ICD-10-CM | POA: Insufficient documentation

## 2014-02-15 MED ORDER — ETONOGESTREL-ETHINYL ESTRADIOL 0.12-0.015 MG/24HR VA RING: VAGINAL_RING | VAGINAL | Status: DC

## 2014-02-15 MED ORDER — ETONOGESTREL-ETHINYL ESTRADIOL 0.12-0.015 MG/24HR VA RING
VAGINAL_RING | VAGINAL | Status: DC
Start: 2014-02-15 — End: 2017-06-19

## 2014-02-15 NOTE — Progress Notes (Signed)
Brianna Walsh 43 y.o.  Filed Vitals:   02/15/14 1118  BP: 110/80     Past Medical History: Past Medical History  Diagnosis Date  . Epigastric abdominal pain 10/05/2013    Past Surgical History: Past Surgical History  Procedure Laterality Date  . No past surgeries      Family History: Family History  Problem Relation Age of Onset  . Other Neg Hx   . Cancer Mother     breast  . Cancer Father     lung  . Cancer Maternal Grandmother   . Cancer Maternal Grandfather     lung    Social History: History  Substance Use Topics  . Smoking status: Former Smoker -- 10 years    Types: Cigarettes    Quit date: 05/10/1997  . Smokeless tobacco: Never Used  . Alcohol Use: No    Allergies:  Allergies  Allergen Reactions  . Penicillins Other (See Comments)    Childhood reaction.     Current outpatient prescriptions:etonogestrel-ethinyl estradiol (NUVARING) 0.12-0.015 MG/24HR vaginal ring, Insert vaginally and leave in place for 3 consecutive weeks, then remove for 1 week., Disp: 1 each, Rfl: 12;  etonogestrel-ethinyl estradiol (NUVARING) 0.12-0.015 MG/24HR vaginal ring, Insert vaginally and leave in place for 3 consecutive weeks, then remove for 1 week., Disp: 1 each, Rfl: 12 ibuprofen (ADVIL,MOTRIN) 600 MG tablet, Take 1 tablet (600 mg total) by mouth every 6 (six) hours as needed for pain., Disp: 30 tablet, Rfl: 0;  OMEPRAZOLE PO, Take 1 tablet by mouth daily as needed. For acid reflux., Disp: , Rfl: ;  Prenatal Vit-Fe Fumarate-FA (PRENATAL MULTIVITAMIN) TABS, Take 1 tablet by mouth daily., Disp: , Rfl:  promethazine (PHENERGAN) 25 MG tablet, Take 1 tablet (25 mg total) by mouth every 6 (six) hours as needed for nausea or vomiting., Disp: 30 tablet, Rfl: 1;  Pseudoeph-CPM-DM-APAP (COLD CAPLETS PO), Take by mouth., Disp: , Rfl:   History of Present Illness: Here for well woman exam.  Had routine mammogram last week, was called back for what hopefully is just calcifications, and  has bx scheduled for 10/14.  Had incidental finding of probable liver hemangioma 5 months ago during a gallbladder US, needs fu next month for repeat US.  Diet improvements have made gallbladder better.  Wants to stay on Nuva Ring. Discussed that pg only or non hormonal would be "the safest" (worried about blood clots), but does not smoke or have any other risk factors, so I'm OK with this   Review of Systems   Patient denies any headaches, blurred vision, shortness of breath, chest pain, abdominal pain, problems with bowel movements, urination, or intercourse.   Physical Exam: General:  Well developed, well nourished, no acute distress Skin:  Warm and dry Neck:  Midline trachea, normal thyroid Lungs; Clear to auscultation bilaterally Breast:  Deferred.  States area of calcifications are not palpable Cardiovascular: Regular rate and rhythm Abdomen:  Soft, non tender, no hepatosplenomegaly Pelvic:  External genitalia is normal in appearance.  The vagina is normal in appearance.  The cervix is bulbous.  Uterus is felt to be normal size, shape, and contour.  No adnexal masses or tenderness noted.  Extremities:  No swelling or varicosities noted Psych:  No mood changes.     Impression: Normal GYN well woman exam F/U liver hemangioma F/U abnormal breast us/mammogram Gets labs with [facility] "all normal"     Plan: Liver US scheduled

## 2014-02-22 ENCOUNTER — Ambulatory Visit
Admission: RE | Admit: 2014-02-22 | Discharge: 2014-02-22 | Disposition: A | Discharge status: Home / Self Care | Payer: 59 | Source: Ambulatory Visit | Attending: Advanced Practice Midwife | Admitting: Advanced Practice Midwife

## 2014-02-22 DIAGNOSIS — R921 Mammographic calcification found on diagnostic imaging of breast: Secondary | ICD-10-CM

## 2014-02-22 DIAGNOSIS — N63 Unspecified lump in unspecified breast: Secondary | ICD-10-CM

## 2014-02-22 DIAGNOSIS — R928 Other abnormal and inconclusive findings on diagnostic imaging of breast: Secondary | ICD-10-CM

## 2014-02-22 LAB — CYTOLOGY - PAP

## 2014-03-13 ENCOUNTER — Encounter: Payer: Self-pay | Admitting: Advanced Practice Midwife

## 2015-01-24 ENCOUNTER — Other Ambulatory Visit: Payer: Self-pay | Admitting: Advanced Practice Midwife

## 2015-08-01 ENCOUNTER — Other Ambulatory Visit: Payer: Self-pay | Admitting: *Deleted

## 2015-08-01 MED ORDER — ETONOGESTREL-ETHINYL ESTRADIOL 0.12-0.015 MG/24HR VA RING: VAGINAL_RING | VAGINAL | Status: DC

## 2016-02-14 ENCOUNTER — Ambulatory Visit (INDEPENDENT_AMBULATORY_CARE_PROVIDER_SITE_OTHER): Payer: 59 | Admitting: Advanced Practice Midwife

## 2016-02-14 ENCOUNTER — Encounter: Payer: Self-pay | Admitting: Advanced Practice Midwife

## 2016-02-14 ENCOUNTER — Other Ambulatory Visit (HOSPITAL_COMMUNITY)
Admission: RE | Admit: 2016-02-14 | Discharge: 2016-02-14 | Disposition: A | Discharge status: Home / Self Care | Payer: 59 | Source: Ambulatory Visit | Attending: Advanced Practice Midwife | Admitting: Advanced Practice Midwife

## 2016-02-14 VITALS — BP 122/84 | HR 76 | Ht 65.0 in | Wt 164.0 lb

## 2016-02-14 DIAGNOSIS — Z01419 Encounter for gynecological examination (general) (routine) without abnormal findings: Secondary | ICD-10-CM | POA: Diagnosis present

## 2016-02-14 DIAGNOSIS — Z13220 Encounter for screening for lipoid disorders: Secondary | ICD-10-CM

## 2016-02-14 DIAGNOSIS — Z1151 Encounter for screening for human papillomavirus (HPV): Secondary | ICD-10-CM | POA: Insufficient documentation

## 2016-02-14 DIAGNOSIS — Z1329 Encounter for screening for other suspected endocrine disorder: Secondary | ICD-10-CM

## 2016-02-14 NOTE — Addendum Note (Signed)
Addended by: Octaviano Glow on: 02/14/2016 05:28 PM   Modules accepted: Orders

## 2016-02-14 NOTE — Progress Notes (Signed)
Brianna Walsh 45 y.o.  Vitals:   02/14/16 1600  BP: 122/84  Pulse: 76     Filed Weights   02/14/16 1600  Weight: 164 lb (74.4 kg)    Past Medical History: Past Medical History:  Diagnosis Date  . Epigastric abdominal pain 10/05/2013    Past Surgical History: Past Surgical History:  Procedure Laterality Date  . NO PAST SURGERIES      Family History: Family History  Problem Relation Age of Onset  . Cancer Mother     breast  . Cancer Father     lung  . Cancer Maternal Grandmother   . Cancer Maternal Grandfather     lung  . Other Neg Hx     Social History: Social History  Substance Use Topics  . Smoking status: Former Smoker    Years: 10.00    Types: Cigarettes    Quit date: 05/10/1997  . Smokeless tobacco: Never Used  . Alcohol use No    Allergies:  Allergies  Allergen Reactions  . Penicillins Other (See Comments)    Childhood reaction.      Current Outpatient Prescriptions:  .  etonogestrel-ethinyl estradiol (NUVARING) 0.12-0.015 MG/24HR vaginal ring, Insert vaginally and leave in place for 3 consecutive weeks, then remove for 1 week., Disp: 1 each, Rfl: 12 .  etonogestrel-ethinyl estradiol (NUVARING) 0.12-0.015 MG/24HR vaginal ring, Insert vaginally and leave in place for 3 consecutive weeks, then remove for 1 week., Disp: 1 each, Rfl: 12 .  ibuprofen (ADVIL,MOTRIN) 600 MG tablet, Take 1 tablet (600 mg total) by mouth every 6 (six) hours as needed for pain. (Patient not taking: Reported on 02/14/2016), Disp: 30 tablet, Rfl: 0 .  NUVARING 0.12-0.015 MG/24HR vaginal ring, INSERT 1 RING VAGINALLY AND LEAVE IN PLACE FOR 3 CONSECUTIVE WEEKS, THEN REMOVE FOR 1 WEEK, Disp: 3 each, Rfl: 3 .  OMEPRAZOLE PO, Take 1 tablet by mouth daily as needed. For acid reflux., Disp: , Rfl:  .  Prenatal Vit-Fe Fumarate-FA (PRENATAL MULTIVITAMIN) TABS, Take 1 tablet by mouth daily., Disp: , Rfl:  .  promethazine (PHENERGAN) 25 MG tablet, Take 1 tablet (25 mg total) by mouth  every 6 (six) hours as needed for nausea or vomiting. (Patient not taking: Reported on 02/14/2016), Disp: 30 tablet, Rfl: 1 .  Pseudoeph-CPM-DM-APAP (COLD CAPLETS PO), Take by mouth., Disp: , Rfl:   History of Present Illness: here for pap.  Had normal one 2 years ago, didn't know the updated scheulde, wants it anyway. Needs mammogram , 3D recommended.  Will also get screening labs   Review of Systems   Patient denies any headaches, blurred vision, shortness of breath, chest pain, abdominal pain, problems with bowel movements, urination, or intercourse.   Physical Exam: General:  Well developed, well nourished, no acute distress Skin:  Warm and dry Neck:  Midline trachea, normal thyroid Lungs; Clear to auscultation bilaterally Breast:  No dominant palpable mass, retraction, or nipple discharge Cardiovascular: Regular rate and rhythm Abdomen:  Soft, non tender, no hepatosplenomegaly Pelvic:  External genitalia is normal in appearance.  The vagina is normal in appearance.  The cervix is bulbous.  Uterus is felt to be normal size, shape, and contour.  No adnexal masses or tenderness noted.  Extremities:  No swelling or varicosities noted Psych:  No mood changes.     Impression: normal gyn exam     Plan: if normal, pap q 3 years.

## 2016-02-18 LAB — CYTOLOGY - PAP

## 2016-03-11 ENCOUNTER — Other Ambulatory Visit: Payer: Self-pay | Admitting: Adult Health

## 2016-03-11 DIAGNOSIS — Z1231 Encounter for screening mammogram for malignant neoplasm of breast: Secondary | ICD-10-CM

## 2016-03-24 ENCOUNTER — Ambulatory Visit (HOSPITAL_COMMUNITY)
Admission: RE | Admit: 2016-03-24 | Discharge: 2016-03-24 | Disposition: A | Discharge status: Home / Self Care | Payer: 59 | Source: Ambulatory Visit | Attending: Adult Health | Admitting: Adult Health

## 2016-03-24 DIAGNOSIS — Z1231 Encounter for screening mammogram for malignant neoplasm of breast: Secondary | ICD-10-CM | POA: Insufficient documentation

## 2016-06-15 ENCOUNTER — Other Ambulatory Visit: Payer: Self-pay | Admitting: Advanced Practice Midwife

## 2017-06-19 ENCOUNTER — Telehealth: Payer: Self-pay | Admitting: *Deleted

## 2017-06-19 MED ORDER — ETONOGESTREL-ETHINYL ESTRADIOL 0.12-0.015 MG/24HR VA RING: VAGINAL_RING | VAGINAL | 3 refills | Status: DC

## 2017-06-19 NOTE — Telephone Encounter (Signed)
Will refill nuva ring

## 2018-06-23 ENCOUNTER — Other Ambulatory Visit: Payer: Self-pay | Admitting: Adult Health

## 2018-07-01 ENCOUNTER — Other Ambulatory Visit: Payer: Self-pay | Admitting: Adult Health

## 2018-08-02 ENCOUNTER — Other Ambulatory Visit: Payer: Self-pay | Admitting: Advanced Practice Midwife

## 2018-08-02 ENCOUNTER — Telehealth: Payer: Self-pay | Admitting: Advanced Practice Midwife

## 2018-08-02 ENCOUNTER — Other Ambulatory Visit: Payer: Self-pay

## 2018-08-02 NOTE — Telephone Encounter (Signed)
Patient came in for her appointment today, her Medicaid FP showed no coverage for this month.  She chose to cancel today because she didn't have the money to pay for the visit.  She would like to see if [name] would call in her a month supply of her bc to give her time to get the insurance cleared up and then she'll call to schedule a P&P.  Brianna Walsh

## 2018-08-02 NOTE — Telephone Encounter (Signed)
Spoke with pt. Pt is requesting a refill on Nuvaring. Was unable to be seen today due to Medicaid not active. Please advise. Thanks!! [place]

## 2018-08-03 MED ORDER — ETONOGESTREL-ETHINYL ESTRADIOL 0.12-0.015 MG/24HR VA RING: VAGINAL_RING | VAGINAL | 1 refills | Status: DC

## 2018-08-03 NOTE — Addendum Note (Signed)
Addended by: Roma Schanz on: 08/03/2018 10:13 AM   Modules accepted: Orders

## 2018-08-04 ENCOUNTER — Telehealth: Payer: Self-pay | Admitting: *Deleted

## 2018-08-04 NOTE — Telephone Encounter (Signed)
Patient states she does not have insurance and wanted to know if we had samples of the medication that was sent in, patient states her insurance won't kick in until April. Please advise

## 2018-08-04 NOTE — Telephone Encounter (Signed)
Spoke with pt letting her know we don't have any NuvaRing samples. Pt voiced understanding. [place]

## 2018-11-10 ENCOUNTER — Telehealth: Payer: Self-pay | Admitting: Adult Health

## 2018-11-10 NOTE — Telephone Encounter (Signed)
Patient scheduled an P/P for 12/28/18 with Anderson Malta.  She is wanting an order for a mammo sent to [facility].  [phone]

## 2018-11-10 NOTE — Telephone Encounter (Signed)
Pt advised she can schedule her own routine mammogram. Pt voiced understanding. [place]

## 2018-11-11 ENCOUNTER — Other Ambulatory Visit (HOSPITAL_COMMUNITY): Payer: Self-pay | Admitting: Adult Health

## 2018-11-11 DIAGNOSIS — Z1231 Encounter for screening mammogram for malignant neoplasm of breast: Secondary | ICD-10-CM

## 2018-11-22 ENCOUNTER — Other Ambulatory Visit: Payer: Self-pay

## 2018-11-22 ENCOUNTER — Ambulatory Visit (HOSPITAL_COMMUNITY)
Admission: RE | Admit: 2018-11-22 | Discharge: 2018-11-22 | Disposition: A | Discharge status: Home / Self Care | Payer: PRIVATE HEALTH INSURANCE | Source: Ambulatory Visit | Attending: Adult Health | Admitting: Adult Health

## 2018-11-22 DIAGNOSIS — Z1231 Encounter for screening mammogram for malignant neoplasm of breast: Secondary | ICD-10-CM | POA: Diagnosis present

## 2018-12-28 ENCOUNTER — Other Ambulatory Visit: Payer: Commercial Managed Care - PPO | Admitting: Adult Health

## 2019-01-11 ENCOUNTER — Telehealth: Payer: Self-pay | Admitting: Advanced Practice Midwife

## 2019-01-11 NOTE — Telephone Encounter (Signed)
Called patient regarding appointment and the following message was left:   We have you scheduled for an upcoming appointment at our office. At this time, patients are encouraged to come alone to their visits whenever possible, however, a support person, over age 50, may accompany you to your appointment if assistance is needed for safety or care concerns. Otherwise, support persons should remain outside until the visit is complete.   We ask if you have had any exposure to anyone suspected or confirmed of having COVID-19 or if you are experiencing any of the following, to call and reschedule your appointment: fever, cough, shortness of breath, muscle pain, diarrhea, rash, vomiting, abdominal pain, red eye, weakness, bruising, bleeding, joint pain, or a severe headache.   Please know we will ask you these questions or similar questions when you arrive for your appointment and again its how we are keeping everyone safe.    Also,to keep you safe, please use the provided hand sanitizer when you enter the office. We are asking everyone in the office to wear a mask to help prevent the spread of germs. If you have a mask of your own, please wear it to your appointment, if not, we are happy to provide one for you.  Thank you for understanding and your cooperation.    [facility] Staff

## 2019-01-12 ENCOUNTER — Ambulatory Visit (INDEPENDENT_AMBULATORY_CARE_PROVIDER_SITE_OTHER): Payer: PRIVATE HEALTH INSURANCE | Admitting: Advanced Practice Midwife

## 2019-01-12 ENCOUNTER — Other Ambulatory Visit (HOSPITAL_COMMUNITY)
Admission: RE | Admit: 2019-01-12 | Discharge: 2019-01-12 | Disposition: A | Discharge status: Home / Self Care | Payer: PRIVATE HEALTH INSURANCE | Source: Ambulatory Visit | Attending: Adult Health | Admitting: Adult Health

## 2019-01-12 ENCOUNTER — Encounter: Payer: Self-pay | Admitting: Advanced Practice Midwife

## 2019-01-12 ENCOUNTER — Other Ambulatory Visit: Payer: Self-pay

## 2019-01-12 VITALS — BP 118/80 | HR 69 | Ht 65.0 in | Wt 154.4 lb

## 2019-01-12 DIAGNOSIS — Z01419 Encounter for gynecological examination (general) (routine) without abnormal findings: Secondary | ICD-10-CM | POA: Insufficient documentation

## 2019-01-12 MED ORDER — ETONOGESTREL-ETHINYL ESTRADIOL 0.12-0.015 MG/24HR VA RING: VAGINAL_RING | VAGINAL | 4 refills | Status: DC

## 2019-01-12 NOTE — Progress Notes (Signed)
Brianna Walsh 48 y.o.  Vitals:   01/12/19 0941  BP: 118/80  Pulse: 69     Filed Weights   01/12/19 0941  Weight: 154 lb 6.4 oz (70 kg)    Past Medical History: Past Medical History:  Diagnosis Date  . Epigastric abdominal pain 10/05/2013    Past Surgical History: Past Surgical History:  Procedure Laterality Date  . NO PAST SURGERIES      Family History: Family History  Problem Relation Age of Onset  . Cancer Mother        breast  . Cancer Father        lung  . Cancer Maternal Grandmother   . Cancer Maternal Grandfather        lung  . Other Neg Hx     Social History: Social History   Tobacco Use  . Smoking status: Former Smoker    Years: 10.00    Types: Cigarettes    Quit date: 05/10/1997    Years since quitting: 21.6  . Smokeless tobacco: Never Used  Substance Use Topics  . Alcohol use: No  . Drug use: No    Allergies:  Allergies  Allergen Reactions  . Penicillins Other (See Comments)    Childhood reaction.      Current Outpatient Medications:  .  etonogestrel-ethinyl estradiol (NUVARING) 0.12-0.015 MG/24HR vaginal ring, INSERT 1 RING VAGINALLY FOR 3 WEEKS THEN REMOVE FOR 1 WEEK, Disp: 3 each, Rfl: 4 .  ibuprofen (ADVIL,MOTRIN) 600 MG tablet, Take 1 tablet (600 mg total) by mouth every 6 (six) hours as needed for pain., Disp: 30 tablet, Rfl: 0 .  OMEPRAZOLE PO, Take 1 tablet by mouth daily as needed. For acid reflux., Disp: , Rfl:  .  Prenatal Vit-Fe Fumarate-FA (PRENATAL MULTIVITAMIN) TABS, Take 1 tablet by mouth daily., Disp: , Rfl:  .  promethazine (PHENERGAN) 25 MG tablet, Take 1 tablet (25 mg total) by mouth every 6 (six) hours as needed for nausea or vomiting. (Patient not taking: Reported on 02/14/2016), Disp: 30 tablet, Rfl: 1 .  Pseudoeph-CPM-DM-APAP (COLD CAPLETS PO), Take by mouth., Disp: , Rfl:   History of Present Illness: here for pap and physical. Last pap 2017, normal. Had mammogram in July that was normal. Needs note to go to gym  (has lost 30 lbs and nervous about gaining it back d/t lack of continued exercise).  Loves nuva ring.    Review of Systems   Patient denies any headaches, blurred vision, shortness of breath, chest pain, abdominal pain, problems with bowel movements, urination, or intercourse.   Physical Exam: General:  Well developed, well nourished, no acute distress Skin:  Warm and dry Neck:  Midline trachea, normal thyroid Lungs; Clear to auscultation bilaterally Breast:  Normal mammogram last month Cardiovascular: Regular rate and rhythm Abdomen:  Soft, non tender, no hepatosplenomegaly Pelvic:  External genitalia is normal in appearance.  The vagina is normal in appearance.  The cervix is bulbous.  Uterus is felt to be normal size, shape, and contour.  No adnexal masses or tenderness noted.  Extremities:  No swelling or varicosities noted Psych:  No mood changes.   Rectal hemocult negatve   Impression: normal GYN exam     Plan: stop NR next year age 48, see what happens w/periods--if no period, confirm w/blood [facility].   Colonoscopy recommended this year.

## 2019-01-14 LAB — CYTOLOGY - PAP
Adequacy: ABSENT
Diagnosis: NEGATIVE
HPV: NOT DETECTED

## 2019-12-30 ENCOUNTER — Other Ambulatory Visit (HOSPITAL_COMMUNITY): Payer: Self-pay | Admitting: Adult Health

## 2019-12-30 ENCOUNTER — Other Ambulatory Visit: Payer: Self-pay | Admitting: Advanced Practice Midwife

## 2019-12-30 DIAGNOSIS — Z1231 Encounter for screening mammogram for malignant neoplasm of breast: Secondary | ICD-10-CM

## 2020-01-18 ENCOUNTER — Ambulatory Visit (HOSPITAL_COMMUNITY)
Admission: RE | Admit: 2020-01-18 | Discharge: 2020-01-18 | Disposition: A | Discharge status: Home / Self Care | Payer: 59 | Source: Ambulatory Visit | Attending: Adult Health | Admitting: Adult Health

## 2020-01-18 ENCOUNTER — Other Ambulatory Visit: Payer: Self-pay

## 2020-01-18 DIAGNOSIS — Z1231 Encounter for screening mammogram for malignant neoplasm of breast: Secondary | ICD-10-CM | POA: Insufficient documentation

## 2020-03-14 ENCOUNTER — Ambulatory Visit
Admission: EM | Admit: 2020-03-14 | Discharge: 2020-03-14 | Disposition: A | Discharge status: Home / Self Care | Payer: 59 | Attending: Emergency Medicine | Admitting: Emergency Medicine

## 2020-03-14 ENCOUNTER — Encounter: Payer: Self-pay | Admitting: Emergency Medicine

## 2020-03-14 ENCOUNTER — Other Ambulatory Visit: Payer: Self-pay

## 2020-03-14 DIAGNOSIS — R059 Cough, unspecified: Secondary | ICD-10-CM

## 2020-03-14 DIAGNOSIS — J069 Acute upper respiratory infection, unspecified: Secondary | ICD-10-CM

## 2020-03-14 MED ORDER — BENZONATATE 100 MG PO CAPS: 100.0000 mg | ORAL_CAPSULE | Freq: Three times a day (TID) | ORAL | 0 refills | Status: DC

## 2020-03-14 MED ORDER — PREDNISONE 10 MG (21) PO TBPK: ORAL_TABLET | Freq: Every day | ORAL | 0 refills | Status: DC

## 2020-03-14 NOTE — Discharge Instructions (Signed)
COVID testing ordered.  It will take between 5-7 days for test results.  Someone will contact you regarding abnormal results.    In the meantime: You should remain isolated in your home for 10 days from symptom onset AND greater than 72 hours after symptoms resolution (absence of fever without the use of fever-reducing medication and improvement in respiratory symptoms), whichever is longer Get plenty of rest and push fluids Tessalon Perles prescribed for cough Prednisone for congestion Use OTC zyrtec for nasal congestion, runny nose, and/or sore throat Use OTC flonase for nasal congestion and runny nose Use medications daily for symptom relief Use OTC medications like ibuprofen or tylenol as needed fever or pain Call or go to the ED if you have any new or worsening symptoms such as fever, worsening cough, shortness of breath, chest tightness, chest pain, turning blue, changes in mental status, etc..Marland Kitchen

## 2020-03-14 NOTE — ED Triage Notes (Signed)
Triaged by provider  

## 2020-03-14 NOTE — ED Provider Notes (Signed)
[facility]   [id number] 03/14/20 Arrival Time: 3244   CC: COVID symptoms  SUBJECTIVE: History from: patient.  Brianna Walsh is a 49 y.o. female who presents with fatigue, body aches, and persistent cough x few days.  Did an at home covid test that was positive 10 days ago.  Family tested positive for covid.  Has tried OTC medications without relief.  Symptoms are made worse with deep breath.  Denies previous symptoms in the past.   Denies fever, chills, SOB, wheezing, chest pain, nausea, changes in bowel or bladder habits.    ROS: As per HPI.  All other pertinent ROS negative.     Past Medical History:  Diagnosis Date  . Epigastric abdominal pain 10/05/2013   Past Surgical History:  Procedure Laterality Date  . NO PAST SURGERIES     Allergies  Allergen Reactions  . Penicillins Other (See Comments)    Childhood reaction.   No current facility-administered medications on file prior to encounter.   Current Outpatient Medications on File Prior to Encounter  Medication Sig Dispense Refill  . etonogestrel-ethinyl estradiol (NUVARING) 0.12-0.015 MG/24HR vaginal ring INSERT 1 RING VAGINALLY FOR 3 WEEKS THEN REMOVE FOR 1 WEEK 3 each 4  . ibuprofen (ADVIL,MOTRIN) 600 MG tablet Take 1 tablet (600 mg total) by mouth every 6 (six) hours as needed for pain. 30 tablet 0  . OMEPRAZOLE PO Take 1 tablet by mouth daily as needed. For acid reflux.    . Prenatal Vit-Fe Fumarate-FA (PRENATAL MULTIVITAMIN) TABS Take 1 tablet by mouth daily.    . [DISCONTINUED] promethazine (PHENERGAN) 25 MG tablet Take 1 tablet (25 mg total) by mouth every 6 (six) hours as needed for nausea or vomiting. (Patient not taking: Reported on 02/14/2016) 30 tablet 1   Social History   Socioeconomic History  . Marital status: Married    Spouse name: Not on file  . Number of children: 4  . Years of education: Not on file  . Highest education level: Not on file  Occupational History  . Not on file  Tobacco  Use  . Smoking status: Former Smoker    Years: 10.00    Types: Cigarettes    Quit date: 05/10/1997    Years since quitting: 22.8  . Smokeless tobacco: Never Used  Substance and Sexual Activity  . Alcohol use: No  . Drug use: No  . Sexual activity: Yes    Birth control/protection: None, Inserts  Other Topics Concern  . Not on file  Social History Narrative  . Not on file   Social Determinants of Health   Financial Resource Strain:   . Difficulty of Paying Living Expenses: Not on file  Food Insecurity:   . Worried About Charity fundraiser in the Last Year: Not on file  . Ran Out of Food in the Last Year: Not on file  Transportation Needs:   . Lack of Transportation (Medical): Not on file  . Lack of Transportation (Non-Medical): Not on file  Physical Activity:   . Days of Exercise per Week: Not on file  . Minutes of Exercise per Session: Not on file  Stress:   . Feeling of Stress : Not on file  Social Connections:   . Frequency of Communication with Friends and Family: Not on file  . Frequency of Social Gatherings with Friends and Family: Not on file  . Attends Religious Services: Not on file  . Active Member of Clubs or Organizations: Not on file  .  Attends Archivist Meetings: Not on file  . Marital Status: Not on file  Intimate Partner Violence:   . Fear of Current or Ex-Partner: Not on file  . Emotionally Abused: Not on file  . Physically Abused: Not on file  . Sexually Abused: Not on file   Family History  Problem Relation Age of Onset  . Cancer Mother        breast  . Cancer Father        lung  . Cancer Maternal Grandmother   . Cancer Maternal Grandfather        lung  . Other Neg Hx     OBJECTIVE:  Vitals:   03/14/20 1157  BP: 132/90  Pulse: (!) 102  Resp: 17  Temp: 98.5 F (36.9 C)  TempSrc: Oral  SpO2: 94%    General appearance: alert; appears fatigued, but nontoxic; speaking in full sentences and tolerating own secretions HEENT:  NCAT; Ears: EACs clear, TMs pearly gray; Eyes: PERRL.  EOM grossly intact. Nose: nares patent without rhinorrhea, Throat: oropharynx clear, tonsils non erythematous or enlarged, uvula midline  Neck: supple without LAD Lungs: unlabored respirations, symmetrical air entry; cough: moderate; no respiratory distress; CTAB Heart: regular rate and rhythm.   Skin: warm and dry Psychological: alert and cooperative; normal mood and affect  ASSESSMENT & PLAN:  1. Cough   2. Viral URI with cough     Meds ordered this encounter  Medications  . benzonatate (TESSALON) 100 MG capsule    Sig: Take 1 capsule (100 mg total) by mouth every 8 (eight) hours.    Dispense:  21 capsule    Refill:  0    Order Specific Question:   Supervising Provider    Answer:   Raylene Everts [[id number]]  . predniSONE (STERAPRED UNI-PAK 21 TAB) 10 MG (21) TBPK tablet    Sig: Take by mouth daily. Take 6 tabs by mouth daily  for 2 days, then 5 tabs for 2 days, then 4 tabs for 2 days, then 3 tabs for 2 days, 2 tabs for 2 days, then 1 tab by mouth daily for 2 days    Dispense:  42 tablet    Refill:  0    Order Specific Question:   Supervising Provider    Answer:   Raylene Everts [[id number]]   COVID testing ordered.  It will take between 5-7 days for test results.  Someone will contact you regarding abnormal results.    In the meantime: You should remain isolated in your home for 10 days from symptom onset AND greater than 72 hours after symptoms resolution (absence of fever without the use of fever-reducing medication and improvement in respiratory symptoms), whichever is longer Get plenty of rest and push fluids Tessalon Perles prescribed for cough Prednisone for congestion Use OTC zyrtec for nasal congestion, runny nose, and/or sore throat Use OTC flonase for nasal congestion and runny nose Use medications daily for symptom relief Use OTC medications like ibuprofen or tylenol as needed fever or pain Call or go to  the ED if you have any new or worsening symptoms such as fever, worsening cough, shortness of breath, chest tightness, chest pain, turning blue, changes in mental status, etc...   Reviewed expectations re: course of current medical issues. Questions answered. Outlined signs and symptoms indicating need for more acute intervention. Patient verbalized understanding. After Visit Summary given.         Lestine Box, PA-C 03/14/20 1200

## 2020-03-16 LAB — COVID-19, FLU A+B AND RSV
Influenza A, NAA: NOT DETECTED
Influenza B, NAA: NOT DETECTED
RSV, NAA: NOT DETECTED
SARS-CoV-2, NAA: NOT DETECTED

## 2020-10-12 ENCOUNTER — Ambulatory Visit
Admission: EM | Admit: 2020-10-12 | Discharge: 2020-10-12 | Disposition: A | Discharge status: Home / Self Care | Payer: 59 | Attending: Emergency Medicine | Admitting: Emergency Medicine

## 2020-10-12 ENCOUNTER — Other Ambulatory Visit: Payer: Self-pay

## 2020-10-12 DIAGNOSIS — J069 Acute upper respiratory infection, unspecified: Secondary | ICD-10-CM | POA: Diagnosis not present

## 2020-10-12 DIAGNOSIS — Z20822 Contact with and (suspected) exposure to covid-19: Secondary | ICD-10-CM | POA: Diagnosis not present

## 2020-10-12 MED ORDER — ALBUTEROL SULFATE HFA 108 (90 BASE) MCG/ACT IN AERS: 1.0000 | INHALATION_SPRAY | RESPIRATORY_TRACT | 0 refills | Status: DC | PRN

## 2020-10-12 MED ORDER — FLUTICASONE PROPIONATE 50 MCG/ACT NA SUSP: 2.0000 | Freq: Every day | NASAL | 0 refills | Status: DC

## 2020-10-12 MED ORDER — IBUPROFEN 600 MG PO TABS: 600.0000 mg | ORAL_TABLET | Freq: Four times a day (QID) | ORAL | 0 refills | Status: DC | PRN

## 2020-10-12 MED ORDER — BENZONATATE 200 MG PO CAPS: 200.0000 mg | ORAL_CAPSULE | Freq: Three times a day (TID) | ORAL | 0 refills | Status: DC | PRN

## 2020-10-12 MED ORDER — AEROCHAMBER PLUS MISC: 2 refills | Status: DC

## 2020-10-12 NOTE — Discharge Instructions (Addendum)
We will call in [facility] for Tamiflu if her flu is positive.  COVID/flu will be back in several days.  Tessalon as needed for the cough, 600 mg of ibuprofen combined with 1000 mg of Tylenol 3-4 times a day as needed for body aches, headaches, Flonase, saline nasal irrigation with a NeilMed sinus rinse and distilled water as often as you want, Mucinex D.  2 puffs from your albuterol inhaler every 4 hours for 2 days, then every 6 hours for 2 days, then as needed.  May back off on this if you start to feel better.  Follow-up with your doctor as needed.

## 2020-10-12 NOTE — ED Provider Notes (Signed)
HPI  SUBJECTIVE:  Brianna Walsh is a 50 y.o. female who presents with a URI starting this morning.  She reports chest congestion, body aches, headaches, postnasal drip, cough, chest pain with coughing, mild shortness of breath, decreased appetite, nausea.  No fevers above 100.4, nasal congestion, rhinorrhea, sore throat, wheezing, vomiting, diarrhea, abdominal pain, allergy symptoms.  No known COVID or flu exposures.  She did not get either vaccines.  Her grandkids have similar URI-like symptoms.  She has had 2 negative home COVID test.  She has tried Mucinex, Aleve, without improvement in her symptoms.  No urinary factors.  No antibiotics in the past month.  Antipyretic in the past 6 hours.  She had COVID in October 21.  PMD: [employer].    Past Medical History:  Diagnosis Date  . Epigastric abdominal pain 10/05/2013    Past Surgical History:  Procedure Laterality Date  . NO PAST SURGERIES      Family History  Problem Relation Age of Onset  . Cancer Mother        breast  . Cancer Father        lung  . Cancer Maternal Grandmother   . Cancer Maternal Grandfather        lung  . Other Neg Hx     Social History   Tobacco Use  . Smoking status: Former Smoker    Years: 10.00    Types: Cigarettes    Quit date: 05/10/1997    Years since quitting: 23.4  . Smokeless tobacco: Never Used  Substance Use Topics  . Alcohol use: No  . Drug use: No    No current facility-administered medications for this encounter.  Current Outpatient Medications:  .  albuterol (VENTOLIN HFA) 108 (90 Base) MCG/ACT inhaler, Inhale 1-2 puffs into the lungs every 4 (four) hours as needed for wheezing or shortness of breath., Disp: 1 each, Rfl: 0 .  benzonatate (TESSALON) 200 MG capsule, Take 1 capsule (200 mg total) by mouth 3 (three) times daily as needed for cough., Disp: 30 capsule, Rfl: 0 .  fluticasone (FLONASE) 50 MCG/ACT nasal spray, Place 2 sprays into both nostrils daily., Disp:  16 g, Rfl: 0 .  ibuprofen (ADVIL) 600 MG tablet, Take 1 tablet (600 mg total) by mouth every 6 (six) hours as needed., Disp: 30 tablet, Rfl: 0 .  Spacer/Aero-Holding Chambers (AEROCHAMBER PLUS) inhaler, Use with inhaler, Disp: 1 each, Rfl: 2 .  etonogestrel-ethinyl estradiol (NUVARING) 0.12-0.015 MG/24HR vaginal ring, INSERT 1 RING VAGINALLY FOR 3 WEEKS THEN REMOVE FOR 1 WEEK, Disp: 3 each, Rfl: 4 .  OMEPRAZOLE PO, Take 1 tablet by mouth daily as needed. For acid reflux., Disp: , Rfl:  .  Prenatal Vit-Fe Fumarate-FA (PRENATAL MULTIVITAMIN) TABS, Take 1 tablet by mouth daily., Disp: , Rfl:   Allergies  Allergen Reactions  . Penicillins Other (See Comments)    Childhood reaction.     ROS  As noted in HPI.   Physical Exam  BP (!) 150/89   Pulse 93   Temp 99.7 F (37.6 C)   Resp 18   SpO2 99%   Constitutional: Well developed, well nourished, no acute distress Eyes:  EOMI, conjunctiva normal bilaterally HENT: Normocephalic, atraumatic,mucus membranes moist.  Positive clear nasal congestion.  No maxillary, frontal sinus tenderness.  Erythematous, swollen turbinates.  Normal oropharynx.  Positive postnasal drip. Respiratory: Normal inspiratory effort, lungs clear bilaterally.  No anterior, lateral chest wall tenderness Cardiovascular: Normal rate, regular rhythm, no murmurs rubs or  gallops. GI: nondistended skin: No rash, skin intact Musculoskeletal: no deformities Neurologic: Alert & oriented x 3, no focal neuro deficits Psychiatric: Speech and behavior appropriate   ED Course   Medications - No data to display  Orders Placed This Encounter  Procedures  . Covid-19, Flu A+B ([facility])    Standing Status:   Standing    Number of Occurrences:   1    No results found for this or any previous visit (from the past 24 hour(s)). No results found.  ED Clinical Impression  1. Upper respiratory tract infection, unspecified type   2. Encounter for laboratory testing for COVID-19  virus      ED Assessment/Plan  Patient with a URI.  Will check COVID and flu.  She will be a candidate for Xofluza or Tamiflu if her flu is positive.  She will not be a candidate for antiviral treatment if her COVID is positive despite her unvaccinated status.  She has no risk factors for progression to severe  Disease.  Sending home with Tessalon, Tylenol/ibuprofen, Flonase, saline nasal irrigation, Mucinex D, regularly scheduled albuterol for the next 4 days as I think she has a component of bronchospasm with this.  Follow-up with PMD as needed.  COVID, flu pending at the time of signing of this note.  Discussed  MDM, treatment plan, and plan for follow-up with patient.  patient agrees with plan.   Meds ordered this encounter  Medications  . fluticasone (FLONASE) 50 MCG/ACT nasal spray    Sig: Place 2 sprays into both nostrils daily.    Dispense:  16 g    Refill:  0  . albuterol (VENTOLIN HFA) 108 (90 Base) MCG/ACT inhaler    Sig: Inhale 1-2 puffs into the lungs every 4 (four) hours as needed for wheezing or shortness of breath.    Dispense:  1 each    Refill:  0  . Spacer/Aero-Holding Chambers (AEROCHAMBER PLUS) inhaler    Sig: Use with inhaler    Dispense:  1 each    Refill:  2    Please educate patient on use  . ibuprofen (ADVIL) 600 MG tablet    Sig: Take 1 tablet (600 mg total) by mouth every 6 (six) hours as needed.    Dispense:  30 tablet    Refill:  0  . benzonatate (TESSALON) 200 MG capsule    Sig: Take 1 capsule (200 mg total) by mouth 3 (three) times daily as needed for cough.    Dispense:  30 capsule    Refill:  0      *This clinic note was created using [employer]. Therefore, there may be occasional mistakes despite careful proofreading.  ?    Melynda Ripple, MD 10/13/20 [phone]

## 2020-10-12 NOTE — ED Triage Notes (Signed)
Pt presents with co nasal congestion and cough that began yesterday

## 2020-10-12 NOTE — ED Triage Notes (Signed)
2 negative covid test at home

## 2020-10-14 ENCOUNTER — Telehealth: Payer: Self-pay | Admitting: Emergency Medicine

## 2020-10-14 LAB — COVID-19, FLU A+B NAA
Influenza A, NAA: DETECTED — AB
Influenza B, NAA: NOT DETECTED
SARS-CoV-2, NAA: NOT DETECTED

## 2020-10-14 MED ORDER — OSELTAMIVIR PHOSPHATE 75 MG PO CAPS: 75.0000 mg | ORAL_CAPSULE | Freq: Two times a day (BID) | ORAL | 0 refills | Status: DC

## 2020-10-15 ENCOUNTER — Encounter (HOSPITAL_COMMUNITY): Payer: Self-pay | Admitting: Emergency Medicine

## 2020-10-15 ENCOUNTER — Telehealth (HOSPITAL_COMMUNITY): Payer: Self-pay | Admitting: Emergency Medicine

## 2020-10-15 NOTE — Telephone Encounter (Signed)
Influenza A positive. Looks like a prescription of tamiflu was called in on 6/5.

## 2020-10-23 ENCOUNTER — Ambulatory Visit
Admission: EM | Admit: 2020-10-23 | Discharge: 2020-10-23 | Disposition: A | Discharge status: Home / Self Care | Payer: 59 | Attending: Family Medicine | Admitting: Family Medicine

## 2020-10-23 ENCOUNTER — Ambulatory Visit (INDEPENDENT_AMBULATORY_CARE_PROVIDER_SITE_OTHER): Payer: 59

## 2020-10-23 ENCOUNTER — Encounter: Payer: Self-pay | Admitting: Emergency Medicine

## 2020-10-23 DIAGNOSIS — R059 Cough, unspecified: Secondary | ICD-10-CM | POA: Diagnosis not present

## 2020-10-23 DIAGNOSIS — J189 Pneumonia, unspecified organism: Secondary | ICD-10-CM

## 2020-10-23 DIAGNOSIS — R509 Fever, unspecified: Secondary | ICD-10-CM

## 2020-10-23 MED ORDER — LEVOFLOXACIN 750 MG PO TABS: 750.0000 mg | ORAL_TABLET | Freq: Every day | ORAL | 0 refills | Status: DC

## 2020-10-23 MED ORDER — HYDROCOD POLST-CPM POLST ER 10-8 MG/5ML PO SUER: 5.0000 mL | Freq: Every evening | ORAL | 0 refills | Status: DC | PRN

## 2020-10-23 NOTE — Discharge Instructions (Addendum)

## 2020-10-23 NOTE — ED Provider Notes (Signed)
[place]   [id number] 10/23/20 Arrival Time: [place] PLAN:  1. Fever, unspecified fever cause   2. Cough   3. Pneumonia of right lung due to infectious organism, unspecified part of lung    I have personally viewed the imaging studies ordered this visit. RLL pneumonia.  Treatment: Meds ordered this encounter  Medications   chlorpheniramine-HYDROcodone (TUSSIONEX PENNKINETIC ER) 10-8 MG/5ML SUER    Sig: Take 5 mLs by mouth at bedtime as needed for cough.    Dispense:  90 mL    Refill:  0   levofloxacin (LEVAQUIN) 750 MG tablet    Sig: Take 1 tablet (750 mg total) by mouth daily.    Dispense:  7 tablet    Refill:  0     Follow-up Information     [facility].   Specialty: Family Medicine Why: As needed. Contact information: [place] [address] [phone]         [facility].   Specialty: Urgent Care Why: If worsening or failing to improve as anticipated. Contact information: [address] [place] [address] [phone]                Outlined signs and symptoms indicating need for more acute intervention. Understanding verbalized. After Visit Summary given.   SUBJECTIVE: History from: patient. Brianna Walsh is a 50 y.o. female who reports + flu last week. Initial fever that resolved sev d ago. Now with persistent worsening cough and night fevers. Denies: difficulty breathing. Normal PO intake without n/v/d. Ambulatory without difficulty. No assoc CP. Very fatigued.  OBJECTIVE:  Vitals:   10/23/20 1800  BP: 135/85  Pulse: 97  Resp: 16  Temp: 98.9 F (37.2 C)  TempSrc: Tympanic  SpO2: 95%    General appearance: alert; no distress Eyes: PERRLA; EOMI; conjunctiva normal HENT: [place]; AT; with nasal congestion Neck: supple  Lungs: speaks full sentences without difficulty; unlabored; persistent coughing; coarse  breath sounds bilaterally Extremities: no edema Skin: warm and dry Neurologic: normal gait Psychological: alert and cooperative; normal mood and affect  Imaging: DG Chest 2 View  Result Date: 10/23/2020 CLINICAL DATA:  Fever cough EXAM: CHEST - 2 VIEW COMPARISON:  None. FINDINGS: Left lung grossly clear. Airspace disease at the right lung base suspicious for a pneumonia. No pleural effusion. Normal cardiomediastinal silhouette. No pneumothorax IMPRESSION: Right lower lobe pneumonia. Imaging follow-up to resolution recommended Electronically Signed   By: Donavan Foil M.D.   On: 10/23/2020 18:26     Allergies  Allergen Reactions   Penicillins Other (See Comments)    Childhood reaction.    Past Medical History:  Diagnosis Date   Epigastric abdominal pain 10/05/2013   Social History   Socioeconomic History   Marital status: Married    Spouse name: Not on file   Number of children: 4   Years of education: Not on file   Highest education level: Not on file  Occupational History   Not on file  Tobacco Use   Smoking status: Former    Years: 10.00    Pack years: 0.00    Types: Cigarettes    Quit date: 05/10/1997    Years since quitting: 23.4   Smokeless tobacco: Never  Substance and Sexual Activity   Alcohol use: No   Drug use: No   Sexual activity: Yes    Birth control/protection: None, Inserts  Other Topics Concern  Not on file  Social History Narrative   Not on file   Social Determinants of Health   Financial Resource Strain: Not on file  Food Insecurity: Not on file  Transportation Needs: Not on file  Physical Activity: Not on file  Stress: Not on file  Social Connections: Not on file  Intimate Partner Violence: Not on file   Family History  Problem Relation Age of Onset   Cancer Mother        breast   Cancer Father        lung   Cancer Maternal Grandmother    Cancer Maternal Grandfather        lung   Other Neg Hx    Past Surgical History:   Procedure Laterality Date   NO PAST SURGERIES       Vanessa Kick, MD 10/23/20 1851

## 2020-10-23 NOTE — ED Triage Notes (Signed)
Pt tested positive for flu last week.  Pt is still coughing and still running a fever at night.

## 2020-12-27 ENCOUNTER — Other Ambulatory Visit (HOSPITAL_COMMUNITY): Payer: Self-pay | Admitting: Physician Assistant

## 2020-12-27 ENCOUNTER — Ambulatory Visit (HOSPITAL_COMMUNITY)
Admission: RE | Admit: 2020-12-27 | Discharge: 2020-12-27 | Disposition: A | Discharge status: Home / Self Care | Payer: 59 | Source: Ambulatory Visit | Attending: Physician Assistant | Admitting: Physician Assistant

## 2020-12-27 ENCOUNTER — Other Ambulatory Visit: Payer: Self-pay

## 2020-12-27 DIAGNOSIS — R059 Cough, unspecified: Secondary | ICD-10-CM

## 2021-01-21 ENCOUNTER — Ambulatory Visit (HOSPITAL_COMMUNITY)
Admission: RE | Admit: 2021-01-21 | Discharge: 2021-01-21 | Disposition: A | Discharge status: Home / Self Care | Payer: 59 | Source: Ambulatory Visit | Attending: Adult Health | Admitting: Adult Health

## 2021-01-21 ENCOUNTER — Other Ambulatory Visit: Payer: Self-pay

## 2021-01-21 ENCOUNTER — Other Ambulatory Visit (HOSPITAL_COMMUNITY): Payer: Self-pay | Admitting: Adult Health

## 2021-01-21 DIAGNOSIS — Z1231 Encounter for screening mammogram for malignant neoplasm of breast: Secondary | ICD-10-CM | POA: Diagnosis present

## 2021-02-05 ENCOUNTER — Ambulatory Visit: Payer: 59 | Admitting: Internal Medicine

## 2021-03-11 ENCOUNTER — Other Ambulatory Visit: Payer: Self-pay | Admitting: Advanced Practice Midwife

## 2021-07-08 ENCOUNTER — Other Ambulatory Visit: Payer: Self-pay

## 2021-11-08 IMAGING — DX DG CHEST 2V
2 series · 2 of 2 positions shown · non-contrast
Comparison: None.

CLINICAL DATA: Fever cough

EXAM:
CHEST - 2 VIEW

[chest pa]
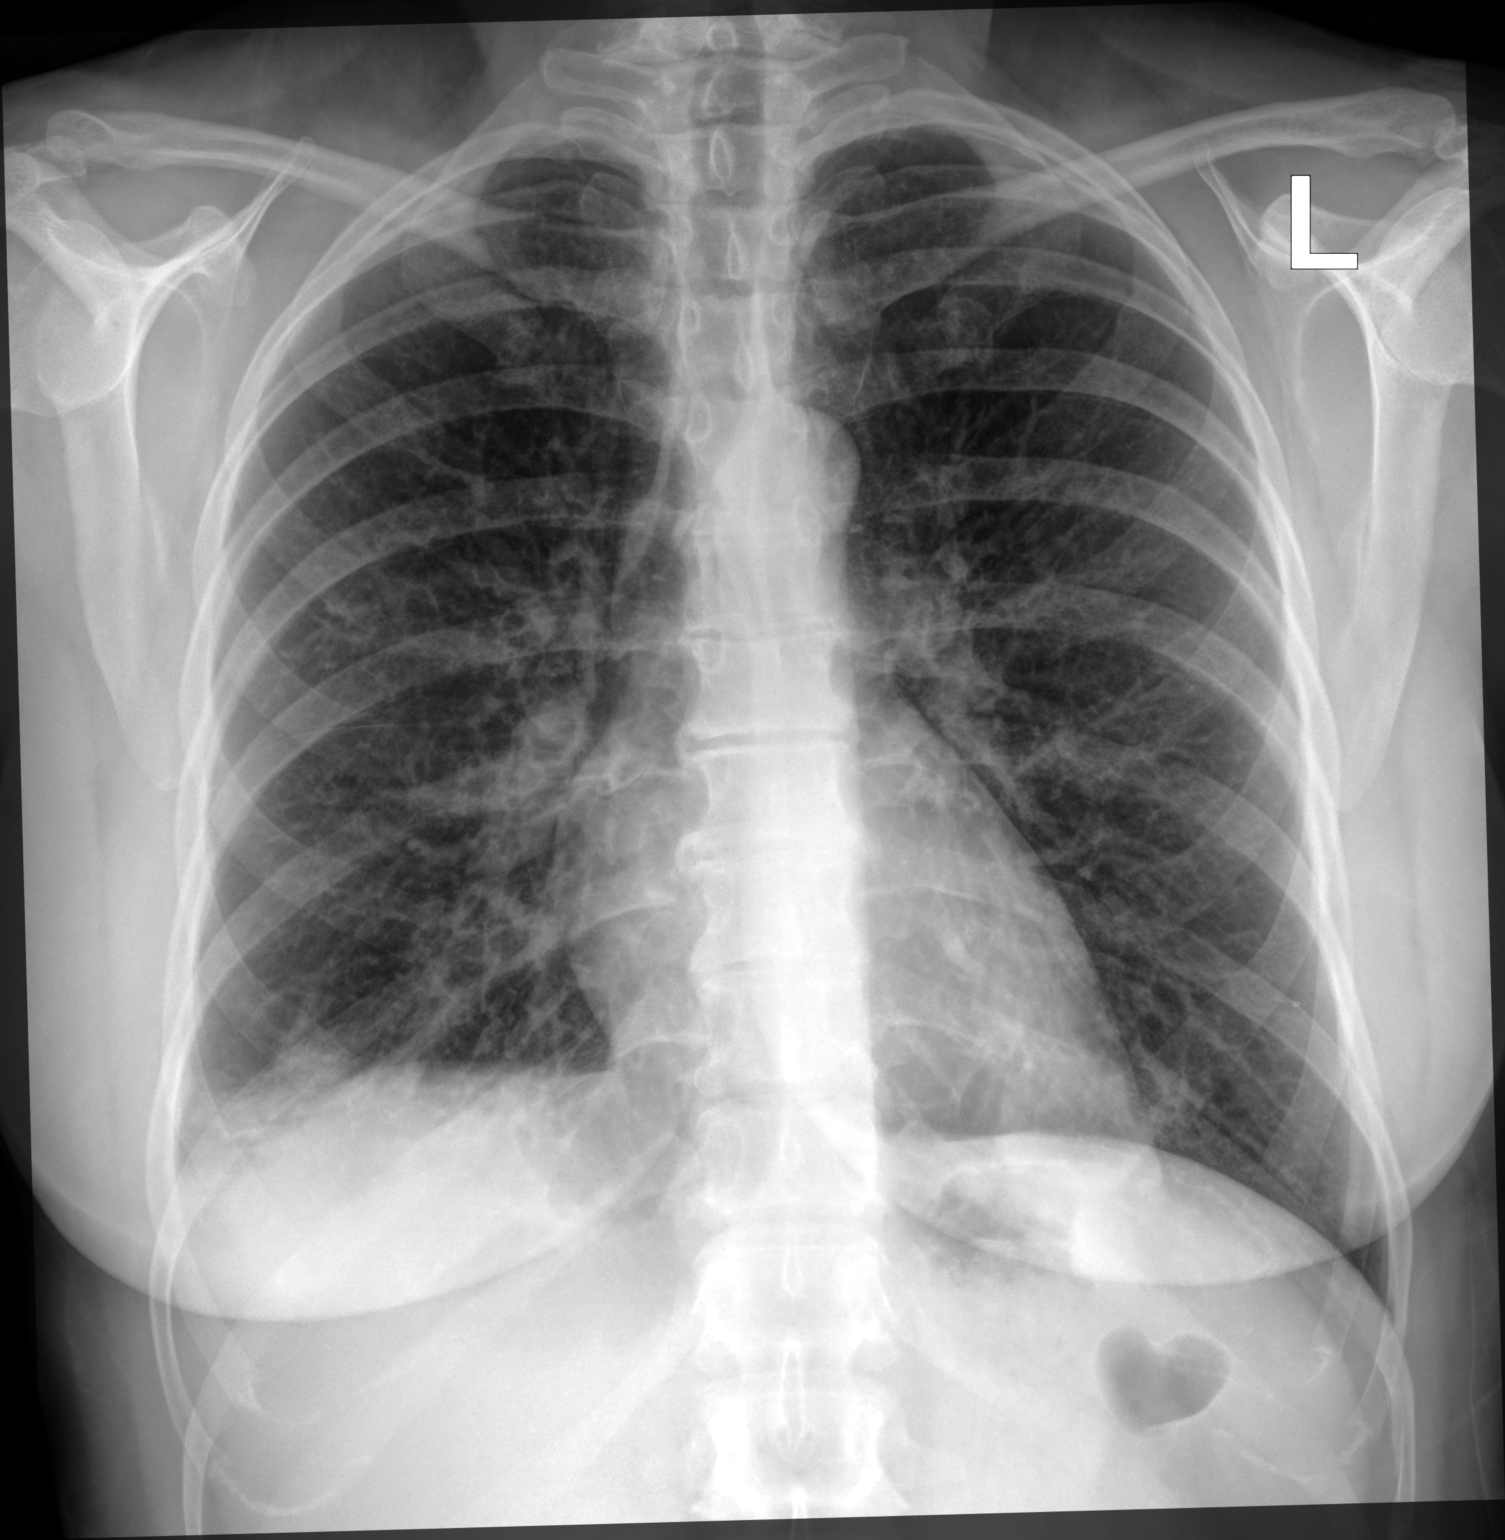

[chest lat]
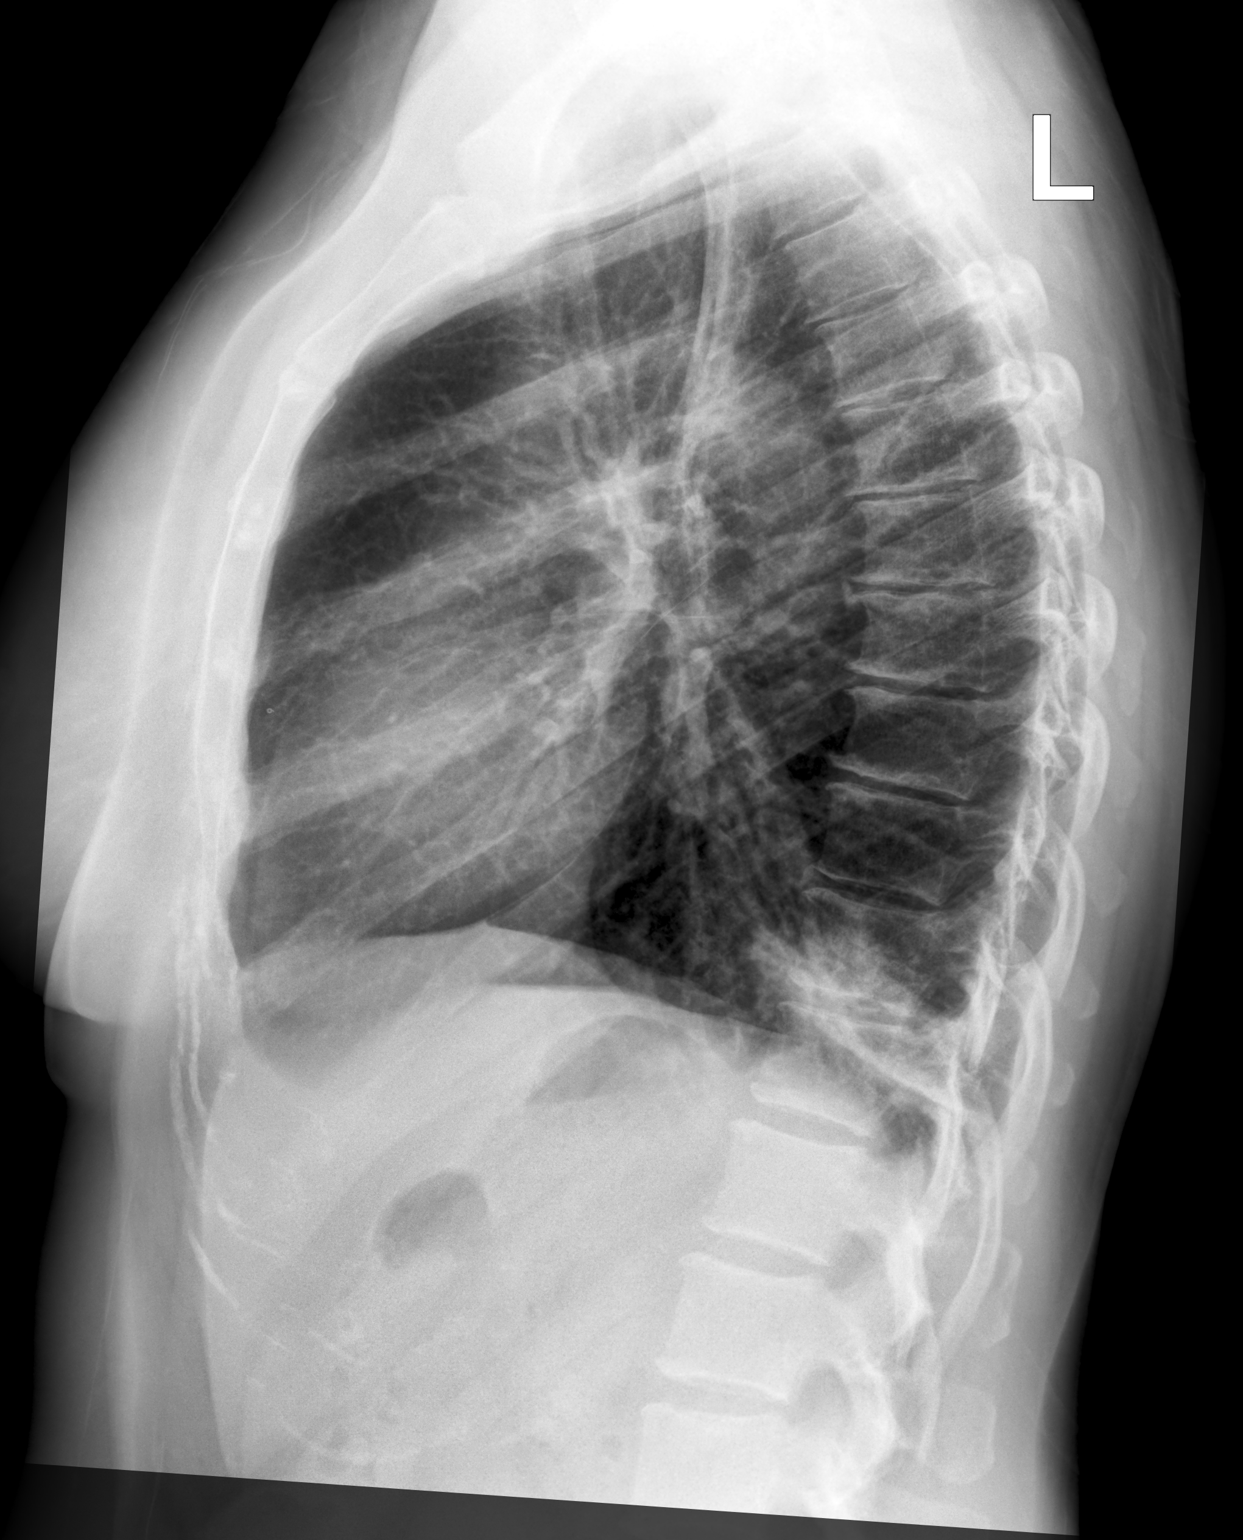

[2 of 2 positions shown; findings below may reference images not displayed]

FINDINGS: Left lung grossly clear. Airspace disease at the right lung base
suspicious for a pneumonia. No pleural effusion. Normal
cardiomediastinal silhouette. No pneumothorax
IMPRESSION: Right lower lobe pneumonia. Imaging follow-up to resolution
recommended

## 2022-02-06 IMAGING — MG MM DIGITAL SCREENING BILAT W/ TOMO AND CAD
8 series · 8 of 24 positions shown · non-contrast
Comparison: Previous exam(s).

CLINICAL DATA: Screening.

EXAM:
DIGITAL SCREENING BILATERAL MAMMOGRAM WITH TOMOSYNTHESIS AND CAD
TECHNIQUE: Bilateral screening digital craniocaudal and mediolateral oblique
mammograms were obtained. Bilateral screening digital breast
tomosynthesis was performed. The images were evaluated with
computer-aided detection.

[L CC synth-2D]
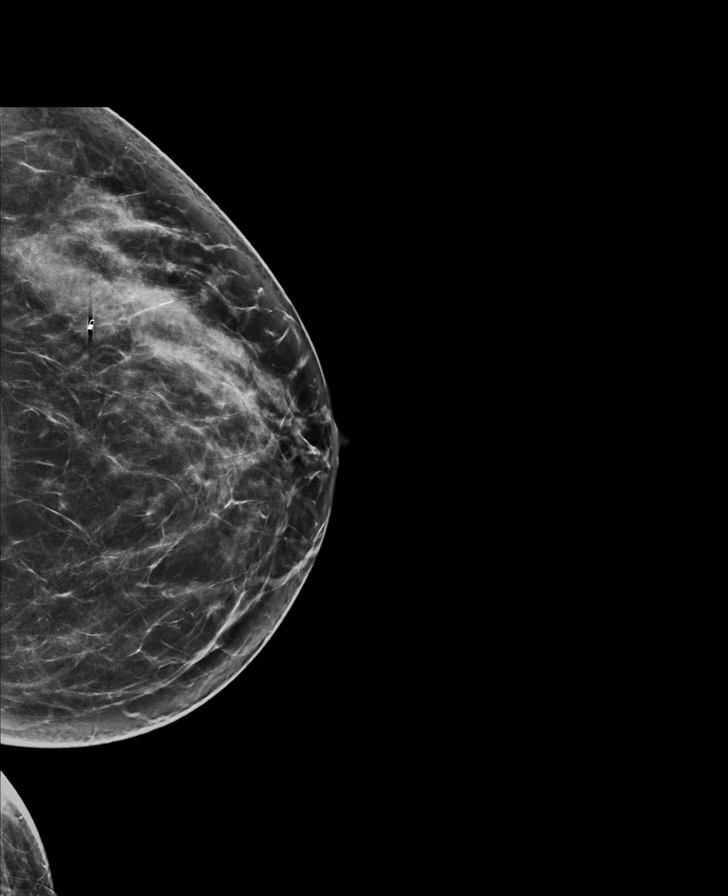

[R CC synth-2D]
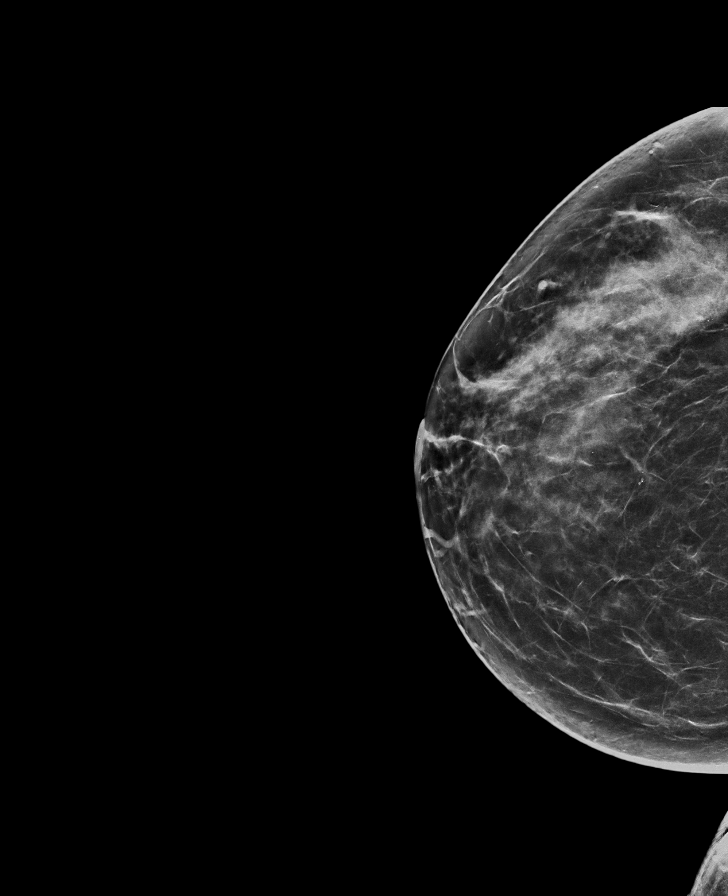

[R MLO synth-2D]
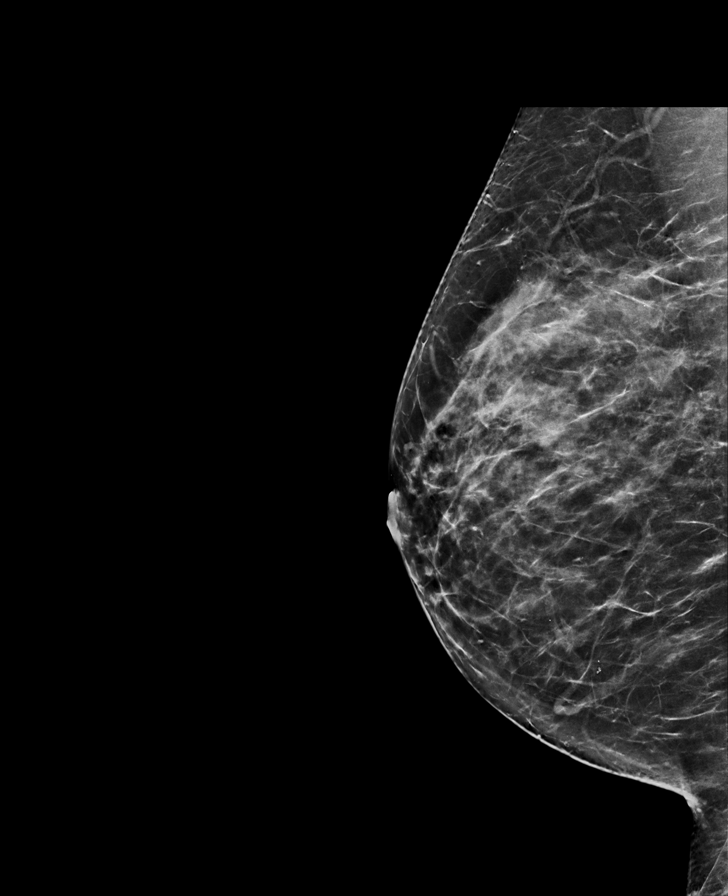

[L MLO synth-2D]
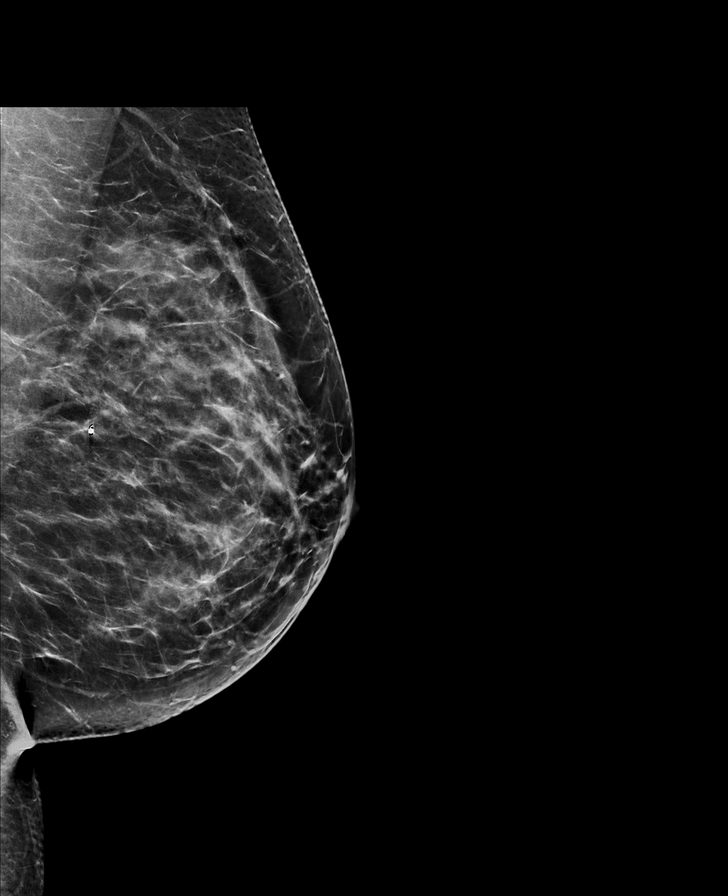

[R CC tomo · tomo slice 39/76.0]
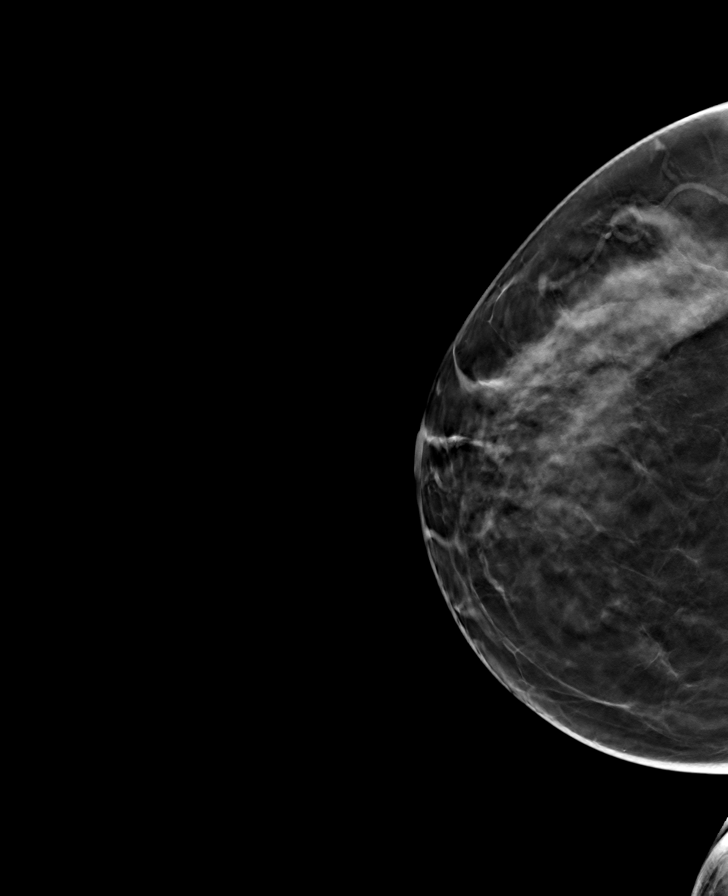

[L MLO tomo · tomo slice 39/77.0]
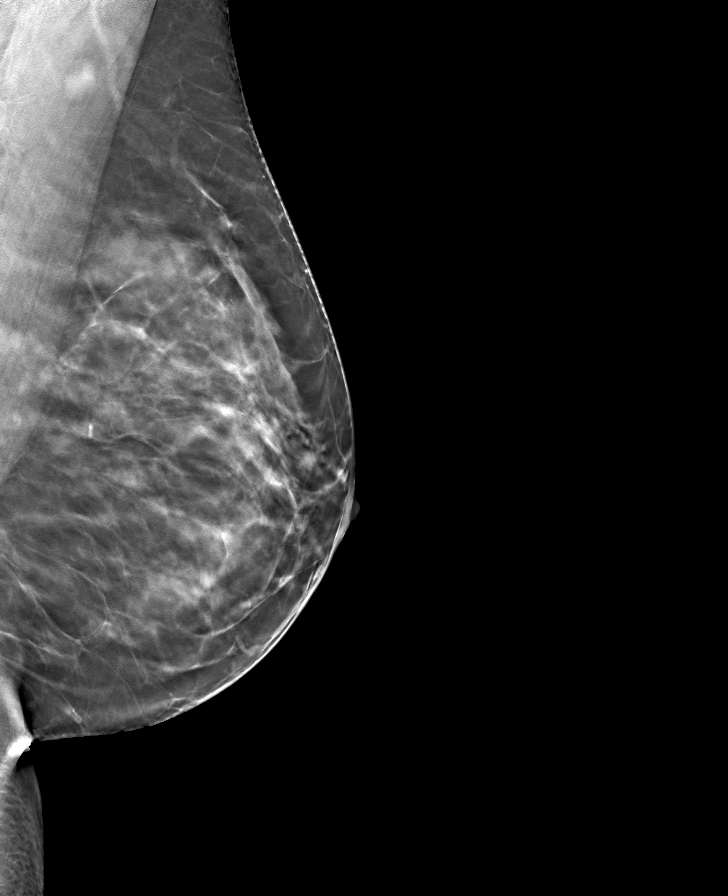

[R MLO tomo · tomo slice 37/73.0]
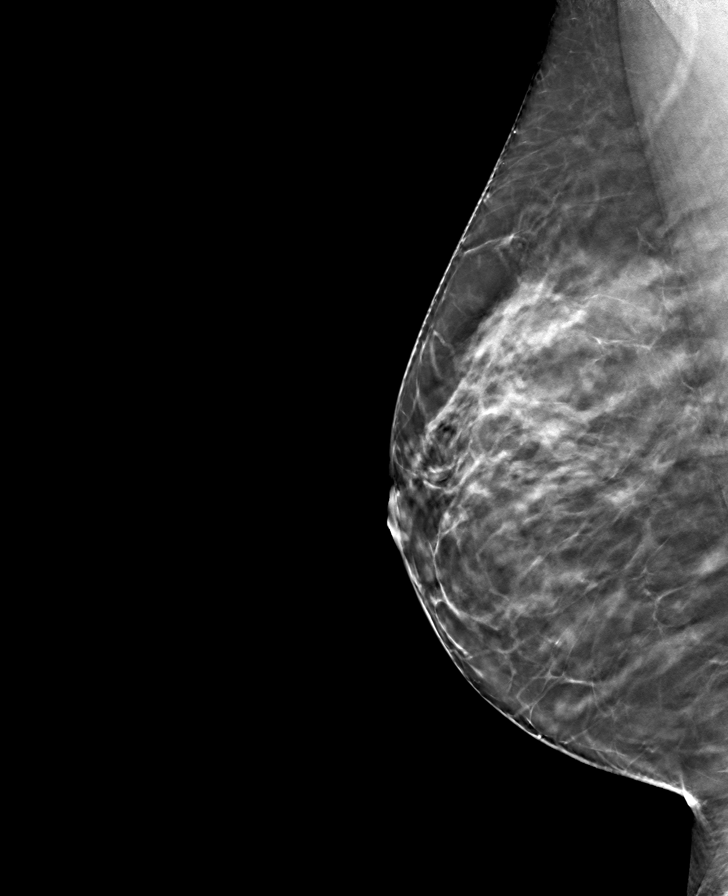

[L CC tomo · tomo slice 41/80.0]
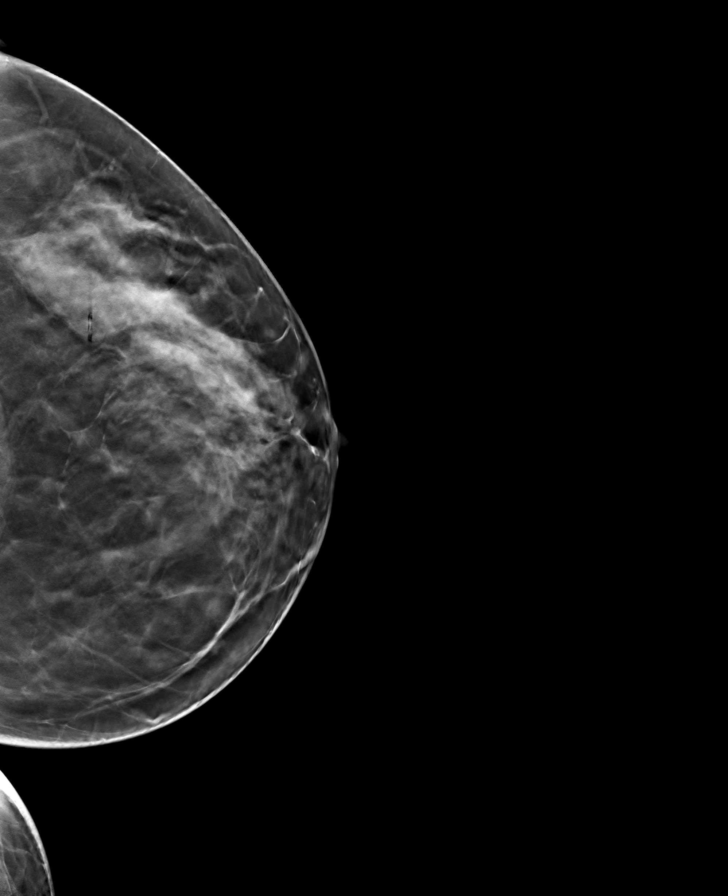

[8 of 24 positions shown; findings below may reference images not displayed]

ACR Breast Density Category c: The breast tissue is heterogeneously
dense, which may obscure small masses.
FINDINGS: There are no findings suspicious for malignancy.
IMPRESSION: No mammographic evidence of malignancy. A result letter of this
screening mammogram will be mailed directly to the patient.

RECOMMENDATION:
Screening mammogram in one year. (Code:Q3-W-BC3)

BI-RADS CATEGORY  1: Negative.

## 2022-04-04 ENCOUNTER — Other Ambulatory Visit: Payer: Self-pay | Admitting: Advanced Practice Midwife

## 2022-04-11 ENCOUNTER — Ambulatory Visit (INDEPENDENT_AMBULATORY_CARE_PROVIDER_SITE_OTHER): Payer: 59

## 2022-04-11 ENCOUNTER — Ambulatory Visit: Payer: 59 | Admitting: Podiatry

## 2022-04-11 DIAGNOSIS — M25371 Other instability, right ankle: Secondary | ICD-10-CM

## 2022-04-11 DIAGNOSIS — M7752 Other enthesopathy of left foot: Secondary | ICD-10-CM

## 2022-04-11 NOTE — Progress Notes (Signed)
   Chief Complaint  Patient presents with   Toe Pain    Patient is here for left foot 2nd toe pain and right foot pain.   Foot Pain    HPI: 51 y.o. female presenting today as a new patient for evaluation of bilateral foot pain.  Patient states that she has a history of chronic ankle instability and mild pain intermittently.  Most recently patient developed pain and tenderness associated to the second toe joint but the pain has since resolved.  She is very active and plays tennis and pickleball and presents for further treatment and evaluation.  Denies a history of injury  Past Medical History:  Diagnosis Date   Epigastric abdominal pain 10/05/2013    Past Surgical History:  Procedure Laterality Date   NO PAST SURGERIES      Allergies  Allergen Reactions   Penicillins Other (See Comments)    Childhood reaction.     Physical Exam: General: The patient is alert and oriented x3 in no acute distress.  Dermatology: Skin is warm, dry and supple bilateral lower extremities. Negative for open lesions or macerations.  Vascular: Palpable pedal pulses bilaterally. Capillary refill within normal limits.  Negative for any significant edema or erythema  Neurological: Light touch and protective threshold grossly intact  Musculoskeletal Exam: No pedal deformities noted.  There is some very mild tenderness to palpation to the second MTP left as well as along the posterior tibial tendon as it inserts onto the navicular tuberosity right  Radiographic Exam B/L feet and RT ankle 04/11/2022:  Normal osseous mineralization. Joint spaces preserved. No fracture/dislocation/boney destruction.  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  Chronic mild ankle instability right 2.  Second MTP capsulitis left   Plan of Care:  1. Patient evaluated. X-Rays reviewed.  2.  Today we discussed different treatment options for the patient.  Fortunately the patient  has been resting her foot over the last few weeks and there is been significant improvement.  Recommend OTC arch supports for now 3.  OTC power step insoles were dispensed at checkout.  Wearing tennis shoes daily 4.  Advised against going barefoot 5.  Return to clinic as needed      Edrick Kins, DPM [facility]  Dr. Edrick Kins, DPM    [address], [place] [address]                Office [phone]  Fax [phone]

## 2023-02-11 ENCOUNTER — Encounter: Payer: Self-pay | Admitting: Obstetrics & Gynecology

## 2023-02-11 ENCOUNTER — Other Ambulatory Visit (HOSPITAL_COMMUNITY)
Admission: RE | Admit: 2023-02-11 | Discharge: 2023-02-11 | Disposition: A | Discharge status: Home / Self Care | Payer: 59 | Source: Ambulatory Visit | Attending: Obstetrics & Gynecology | Admitting: Obstetrics & Gynecology

## 2023-02-11 ENCOUNTER — Ambulatory Visit (INDEPENDENT_AMBULATORY_CARE_PROVIDER_SITE_OTHER): Payer: 59 | Admitting: Obstetrics & Gynecology

## 2023-02-11 VITALS — BP 133/86 | HR 74 | Ht 65.0 in | Wt 173.2 lb

## 2023-02-11 DIAGNOSIS — Z13220 Encounter for screening for lipoid disorders: Secondary | ICD-10-CM

## 2023-02-11 DIAGNOSIS — Z1231 Encounter for screening mammogram for malignant neoplasm of breast: Secondary | ICD-10-CM

## 2023-02-11 DIAGNOSIS — Z3044 Encounter for surveillance of vaginal ring hormonal contraceptive device: Secondary | ICD-10-CM

## 2023-02-11 DIAGNOSIS — Z1321 Encounter for screening for nutritional disorder: Secondary | ICD-10-CM

## 2023-02-11 DIAGNOSIS — Z131 Encounter for screening for diabetes mellitus: Secondary | ICD-10-CM

## 2023-02-11 DIAGNOSIS — Z01419 Encounter for gynecological examination (general) (routine) without abnormal findings: Secondary | ICD-10-CM

## 2023-02-11 DIAGNOSIS — Z1329 Encounter for screening for other suspected endocrine disorder: Secondary | ICD-10-CM

## 2023-02-11 MED ORDER — NUVARING 0.12-0.015 MG/24HR VA RING: VAGINAL_RING | VAGINAL | 4 refills | Status: DC

## 2023-02-11 NOTE — Progress Notes (Signed)
WELL-WOMAN EXAMINATION Patient name: Brianna Walsh MRN [id number]  Date of birth: March 12, 1971 Chief Complaint:   Gynecologic Exam  History of Present Illness:   Brianna Walsh is a 52 y.o. [phone] female being seen today for a routine well-woman exam.   Patient is currently still on vaginal ring as she had a spontaneous pregnancy in her mid 63s.  She is still having a regular period each month with NuvaRing.  Denies vaginal discharge, itching, irritation.  Denies pelvic or abdominal pain.  She reports no acute GYN concerns  Of note she is concerned about going off the pill as she reports that one month, she still felt ovulatory pain   Patient's last menstrual period was 02/04/2023.  The current method of family planning is NuvaRing vaginal inserts.    Last pap 2020.  Last mammogram: ordered. Last colonoscopy: pt thinks cologuard completed     02/11/2023    2:19 PM 01/12/2019   10:00 AM  Depression screen PHQ 2/9  Decreased Interest 0 0  Down, Depressed, Hopeless 0 0  PHQ - 2 Score 0 0  Altered sleeping 0 0  Tired, decreased energy 0 0  Change in appetite 0 0  Feeling bad or failure about yourself  0 0  Trouble concentrating 0 0  Moving slowly or fidgety/restless 0 0  Suicidal thoughts 0 0  PHQ-9 Score 0 0      Review of Systems:   Pertinent items are noted in HPI Denies any headaches, blurred vision, fatigue, shortness of breath, chest pain, abdominal pain, bowel movements, urination, or intercourse unless otherwise stated above.  Pertinent History Reviewed:  Reviewed past medical,surgical, social and family history.  Reviewed problem list, medications and allergies. Physical Assessment:   Vitals:   02/11/23 1410  BP: 133/86  Pulse: 74  Weight: 173 lb 3.2 oz (78.6 kg)  Height: 5\' 5"  (1.651 m)  Body mass index is 28.82 kg/m.        Physical Examination:   General appearance - well appearing, and in no distress  Mental status - alert, oriented to person,  place, and time  Psych:  She has a normal mood and affect  Skin - warm and dry, normal color, no suspicious lesions noted  Chest - effort normal, all lung fields clear to auscultation bilaterally  Heart - normal rate and regular rhythm  Neck:  midline trachea, no thyromegaly or nodules  Breasts - breasts appear normal, no suspicious masses, no skin or nipple changes or  axillary nodes  Abdomen - soft, nontender, nondistended, no masses or organomegaly  Pelvic - VULVA: normal appearing vulva with no masses, tenderness or lesions  VAGINA: normal appearing vagina with normal color and discharge, no lesions  CERVIX: normal appearing cervix without discharge or lesions, no CMT, NuvaRing in place  Thin prep pap is done with HR HPV cotesting  UTERUS: uterus is felt to be normal size, shape, consistency and nontender   ADNEXA: No adnexal masses or tenderness noted.  Extremities:  No swelling or varicosities noted  Chaperone: Faith Rogue     Assessment & Plan:  1) Well-Woman Exam -pap collected, reviewed screening guidelines -mammogram ordered  2) Contraceptive management/perimenopausal status -Discussion with patient regarding risk benefits of continuing with current NuvaRing -Discussed that while she is currently on hormonal therapy, there is not a test to indicate when she is or will be going through menopause -Patient does not have any contraindications, at this time we will continue with current ring []   Next year will consider discontinued use  3) preventive screening -Lab work ordered -Per patient Cologuard completed within the last 3 years  Orders Placed This Encounter  Procedures   MM 3D SCREENING MAMMOGRAM BILATERAL BREAST   CBC   Comprehensive metabolic panel   Lipid panel   TSH   VITAMIN D 25 Hydroxy (Vit-D Deficiency, Fractures)   HgB A1c    Meds:  Meds ordered this encounter  Medications   NUVARING 0.12-0.015 MG/24HR vaginal ring    Sig: Insert vaginally and  leave in place for 3 consecutive weeks, then remove for 1 week.    Dispense:  3 each    Refill:  4    Follow-up: Return in about 1 year (around 02/11/2024) for Annual.   Myna Hidalgo, DO Attending Obstetrician & Gynecologist, [facility] for [facility], [facility]

## 2023-02-18 LAB — CYTOLOGY - PAP
Adequacy: ABSENT
Comment: NEGATIVE
Diagnosis: NEGATIVE
High risk HPV: NEGATIVE

## 2023-04-11 ENCOUNTER — Other Ambulatory Visit: Payer: Self-pay | Admitting: Advanced Practice Midwife

## 2023-04-11 DIAGNOSIS — Z3044 Encounter for surveillance of vaginal ring hormonal contraceptive device: Secondary | ICD-10-CM
